# Patient Record
Sex: Female | Born: 1995 | Race: Black or African American | Hispanic: No | Marital: Single | Smoking: Former smoker
Health system: Southern US, Community
[De-identification: ages and names within clinical notes are randomized; demographics above are authoritative.]

## PROBLEM LIST (undated history)

## (undated) DIAGNOSIS — O409XX0 Polyhydramnios, unspecified trimester, not applicable or unspecified: Secondary | ICD-10-CM

## (undated) DIAGNOSIS — J45909 Unspecified asthma, uncomplicated: Secondary | ICD-10-CM

## (undated) DIAGNOSIS — D563 Thalassemia minor: Secondary | ICD-10-CM

## (undated) DIAGNOSIS — E669 Obesity, unspecified: Secondary | ICD-10-CM

## (undated) DIAGNOSIS — J189 Pneumonia, unspecified organism: Secondary | ICD-10-CM

## (undated) HISTORY — PX: NO PAST SURGERIES: SHX2092

---
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
## 2017-06-15 ENCOUNTER — Inpatient Hospital Stay (HOSPITAL_COMMUNITY)
Admission: AD | Admit: 2017-06-15 | Discharge: 2017-06-15 | Disposition: A | Discharge status: Home / Self Care | Payer: Medicaid Other | Source: Ambulatory Visit | Attending: Obstetrics & Gynecology | Admitting: Obstetrics & Gynecology

## 2017-06-15 ENCOUNTER — Encounter (HOSPITAL_COMMUNITY): Payer: Self-pay

## 2017-06-15 DIAGNOSIS — J101 Influenza due to other identified influenza virus with other respiratory manifestations: Secondary | ICD-10-CM | POA: Diagnosis not present

## 2017-06-15 DIAGNOSIS — F17210 Nicotine dependence, cigarettes, uncomplicated: Secondary | ICD-10-CM | POA: Insufficient documentation

## 2017-06-15 DIAGNOSIS — R05 Cough: Secondary | ICD-10-CM | POA: Diagnosis present

## 2017-06-15 DIAGNOSIS — R51 Headache: Secondary | ICD-10-CM | POA: Diagnosis present

## 2017-06-15 LAB — INFLUENZA PANEL BY PCR (TYPE A & B)
INFLBPCR: NEGATIVE
Influenza A By PCR: POSITIVE — AB

## 2017-06-15 MED ORDER — PROMETHAZINE HCL 25 MG PO TABS: 25.0000 mg | ORAL_TABLET | Freq: Four times a day (QID) | ORAL | 2 refills | Status: DC | PRN

## 2017-06-15 MED ORDER — BENZONATATE 100 MG PO CAPS: 200.0000 mg | ORAL_CAPSULE | Freq: Three times a day (TID) | ORAL | 0 refills | Status: DC | PRN

## 2017-06-15 MED ORDER — GUAIFENESIN ER 600 MG PO TB12: 600.0000 mg | ORAL_TABLET | Freq: Two times a day (BID) | ORAL | 1 refills | Status: DC

## 2017-06-15 MED ORDER — PSEUDOEPHEDRINE HCL 30 MG PO TABS: 30.0000 mg | ORAL_TABLET | ORAL | 0 refills | Status: DC | PRN

## 2017-06-15 NOTE — MAU Provider Note (Addendum)
History    Michelle Gallagher is a 22 y.o. Female with hx of asthma who presents due to feeling unwell. Her symptoms started last night. She experienced chills, night sweats, a productive cough, and a headache. The patient took Ibuprofen 200mg  this morning with no relief. She further endorses decreased appetite and fatigue. Walking exacerbates her symptoms while sleeping makes her feel better. Her cousin had the flu 2 weeks ago. The patient did not receive the flu vaccination this year. She denies abdominal pain, nausea, vomiting, or diarrhea.   The patient's exhibits a cough that is productive of yellow, thick sputum. She denies shortness of breath, wheezing, or chest pain. Physical exertion increases her coughing episodes. She is exposed to smoking and tobacco. Her past medical history is significant for asthma.   Her headaches are located in the frontal region. She describes the pain as aching and throbbing, but does not radiate. The pain is 5/10. She has tried NSAIDS, but no relief in symptoms. She denies neck stiffness, vision changes, sore throat and vomiting.    CSN: [id number]  Arrival date and time: 06/15/17 1645   None     Chief Complaint  Patient presents with  . Cough  . Nasal Congestion  . Chills  . Headache     OB History    Gravida Para Term Preterm AB Living   0 0 0 0 0 0   SAB TAB Ectopic Multiple Live Births   0 0 0 0 0      History reviewed. No pertinent past medical history.  History reviewed. No pertinent surgical history.  History reviewed. No pertinent family history.  Social History   Tobacco Use  . Smoking status: Current Some Day Smoker    Types: Cigarettes  . Smokeless tobacco: Never Used  Substance Use Topics  . Alcohol use: Yes  . Drug use: No    Allergies:  Allergies  Allergen Reactions  . Pollen Extract     No medications prior to admission.    Review of Systems  Constitutional: Positive for chills, diaphoresis and fatigue.  HENT:  Negative for ear pain and sore throat.   Respiratory: Positive for cough. Negative for shortness of breath and wheezing.   Gastrointestinal: Negative for abdominal pain, nausea and vomiting.  Neurological: Positive for headaches.   Physical Exam   Blood pressure (!) 125/53, pulse (!) 113, temperature 99.6 F (37.6 C), temperature source Oral, resp. rate 19, height 5\' 5"  (1.651 m), weight 130.6 kg (288 lb), last menstrual period 06/13/2017, SpO2 100 %.  Physical Exam  Constitutional: She is oriented to person, place, and time. She appears well-developed and well-nourished. She is cooperative.  HENT:  Right Ear: External ear normal.  Left Ear: External ear normal.  Mouth/Throat: Oropharynx is clear and moist.  Neck: Normal range of motion.  Cardiovascular: Normal rate, regular rhythm and normal heart sounds.  Respiratory: Effort normal and breath sounds normal.  GI: There is no tenderness.  Lymphadenopathy:    She has no cervical adenopathy.  Neurological: She is alert and oriented to person, place, and time.  Skin: Skin is warm and dry.  Psychiatric: She has a normal mood and affect.    MAU Course  Procedures  MDM: The patient presents with signs and symptoms consistent with influenza. She presents with reports of night sweats, chills, anorexia, fatigue, and a headache. She has a low grade fever, nasal congestion and a productive cough. Influenza PCR confirmed patient had Influenza A.   Assessment  and Plan  Influenza A Medications: Tessealon Pearls to reduce cough. Pseudoephedrine to decrease nasal congestion Promethazine for resolution of nausea Guaifenesin for cough suppresion  Pt advised to stay hydrated. Drink plenty of fluids and initiate a bland diet.   If symptoms don't improve or worsen she should follow up with her family physician.   Anitra Lauth 06/15/2017, 6:03 PM   .I confirm that I have verified the information documented in the Student PA's note and  that I have also personally reperformed the physical exam and all medical decision making activities. The patient was seen and examined by me also Agree with note  Rx Mucinex for congestion Rx Tessalon for cough Rx Phenergan for nausea and sleep Rx Sudafed for congestion Not in high risk group for Tamiflu  Ready for discharge Will have her followup in clinic as scheduled Aviva Signs, CNM

## 2017-06-15 NOTE — Discharge Instructions (Signed)

## 2017-06-15 NOTE — MAU Note (Signed)
Pt reports symptoms since last pm of ? Fever, chills, nasal congestion, cough,

## 2017-06-16 LAB — POCT PREGNANCY, URINE: Preg Test, Ur: NEGATIVE

## 2018-02-03 ENCOUNTER — Emergency Department (HOSPITAL_COMMUNITY)
Admission: EM | Admit: 2018-02-03 | Discharge: 2018-02-03 | Disposition: A | Discharge status: Home / Self Care | Payer: Medicaid Other | Attending: Emergency Medicine | Admitting: Emergency Medicine

## 2018-02-03 DIAGNOSIS — F17210 Nicotine dependence, cigarettes, uncomplicated: Secondary | ICD-10-CM | POA: Insufficient documentation

## 2018-02-03 DIAGNOSIS — J029 Acute pharyngitis, unspecified: Secondary | ICD-10-CM

## 2018-02-03 LAB — GROUP A STREP BY PCR: GROUP A STREP BY PCR: NOT DETECTED

## 2018-02-03 NOTE — Discharge Instructions (Signed)
Take tylenol or motrin. Get help right away if: Your neck becomes stiff. You drool or are unable to swallow liquids. You cannot drink or take medicines without vomiting. You have severe pain that does not go away, even after you take medicine. You have trouble breathing, and it is not caused by a stuffy nose. You have new pain and swelling in your joints such as the knees, ankles, wrists, or elbows.

## 2018-02-03 NOTE — ED Notes (Signed)
Up for d/c  Papers not ready

## 2018-02-03 NOTE — ED Notes (Signed)
Still no d/c papers

## 2018-02-03 NOTE — ED Provider Notes (Signed)
[facility] EMERGENCY DEPARTMENT Provider Note   CSN: [id number] Arrival date & time: 02/03/18  1258     History   Chief Complaint Chief Complaint  Patient presents with  . Sore Throat    HPI Michelle Gallagher is a 22 y.o. female who presents to the ER with cc of sore throat.  She has had symptoms of sore throat for the past 2 days.  She had 2 episodes of vomiting.  She is able to tolerate her own saliva.  She denies fevers or chills.  She has no contacts with similar symptoms.  She denies nasal congestion.  HPI  No past medical history on file.  There are no active problems to display for this patient.   No past surgical history on file.   OB History    Gravida  0   Para  0   Term  0   Preterm  0   AB  0   Living  0     SAB  0   TAB  0   Ectopic  0   Multiple  0   Live Births  0            Home Medications    Prior to Admission medications   Medication Sig Start Date End Date Taking? Authorizing Provider  benzonatate (TESSALON PERLES) 100 MG capsule Take 2 capsules (200 mg total) by mouth 3 (three) times daily as needed for cough. 06/15/17   Aviva Signs, CNM  guaiFENesin (MUCINEX) 600 MG 12 hr tablet Take 1 tablet (600 mg total) by mouth 2 (two) times daily. 06/15/17   Aviva Signs, CNM  promethazine (PHENERGAN) 25 MG tablet Take 1 tablet (25 mg total) by mouth every 6 (six) hours as needed for nausea or vomiting. 06/15/17   Aviva Signs, CNM  pseudoephedrine (SUDAFED) 30 MG tablet Take 1 tablet (30 mg total) by mouth every 4 (four) hours as needed for congestion. 06/15/17   Aviva Signs, CNM    Family History No family history on file.  Social History Social History   Tobacco Use  . Smoking status: Current Some Day Smoker    Types: Cigarettes  . Smokeless tobacco: Never Used  Substance Use Topics  . Alcohol use: Yes  . Drug use: No     Allergies   Pollen extract   Review of Systems Review  of Systems Visited for sore throat, negative for nasal congestion abdominal pain.  Physical Exam Updated Vital Signs BP 122/67 (BP Location: Right Arm)   Pulse 74   Temp 98.4 F (36.9 C) (Oral)   Resp 16   LMP 01/27/2018   SpO2 100%   Physical Exam  Physical Exam  Nursing note and vitals reviewed. Constitutional: She is oriented to person, place, and time. She appears well-developed and well-nourished. No distress.  HENT:  Head: Normocephalic and atraumatic.  Eyes: Conjunctivae normal and EOM are normal. Pupils are equal, round, and reactive to light. No scleral icterus.  Mouth: Mild posterior pharyngeal erythema and swelling without exudate Neck: Normal range of motion. Positive for tonsillar and anterior cervical adenopathy Cardiovascular: Normal rate, regular rhythm and normal heart sounds.  Exam reveals no gallop and no friction rub.   No murmur heard. Pulmonary/Chest: Effort normal and breath sounds normal. No respiratory distress.  Abdominal: Soft. Bowel sounds are normal. She exhibits no distension and no mass. There is no tenderness. There is no guarding.  Neurological: She is alert  and oriented to person, place, and time.  Skin: Skin is warm and dry. She is not diaphoretic.    ED Treatments / Results  Labs (all labs ordered are listed, but only abnormal results are displayed) Labs Reviewed  GROUP A STREP BY PCR    EKG None  Radiology No results found.  Procedures Procedures (including critical care time)  Medications Ordered in ED Medications - No data to display   Initial Impression / Assessment and Plan / ED Course  I have reviewed the triage vital signs and the nursing notes.  Pertinent labs & imaging results that were available during my care of the patient were reviewed by me and considered in my medical decision making (see chart for details).     .Pt afebrile without tonsillar exudate, negative strep. Presents with mild cervical  lymphadenopathy, & dysphagia; diagnosis of viral pharyngitis. No abx indicated. DC w symptomatic tx for pain  Pt does not appear dehydrated, but did discuss importance of water rehydration. Presentation non concerning for PTA or infxn spread to soft tissue. No trismus or uvula deviation. Specific return precautions discussed. Pt able to drink water in ED without difficulty with intact air way. Recommended PCP follow up.   Final Clinical Impressions(s) / ED Diagnoses   Final diagnoses:  Viral pharyngitis    ED Discharge Orders    None       Arthor Captain, PA-C 02/03/18 1531    Linwood Dibbles, MD 02/05/18 [phone]

## 2018-02-03 NOTE — ED Triage Notes (Addendum)
Pt reports 2 days of coughing and sore throat, pain worse with swallowing and coughing. Pt states her cough today has been productive with thick mucus and she noticed some red streaks that looks like blood. Tonsils are enlarged. Denies any sob or chest pain.

## 2018-02-03 NOTE — ED Notes (Signed)
Strep swab sent lab

## 2018-02-03 NOTE — ED Notes (Signed)
s asked if she minded the pts mother asking questions about the pts condition  When the person in room reported she was the mother and she needed to ask  I was just asking for the pts permission

## 2018-12-14 ENCOUNTER — Other Ambulatory Visit: Payer: Self-pay

## 2018-12-14 ENCOUNTER — Ambulatory Visit (HOSPITAL_COMMUNITY)
Admission: EM | Admit: 2018-12-14 | Discharge: 2018-12-14 | Disposition: A | Discharge status: Home / Self Care | Payer: Medicaid Other | Attending: Family Medicine | Admitting: Family Medicine

## 2018-12-14 ENCOUNTER — Encounter (HOSPITAL_COMMUNITY): Payer: Self-pay | Admitting: Emergency Medicine

## 2018-12-14 DIAGNOSIS — N898 Other specified noninflammatory disorders of vagina: Secondary | ICD-10-CM

## 2018-12-14 DIAGNOSIS — Z202 Contact with and (suspected) exposure to infections with a predominantly sexual mode of transmission: Secondary | ICD-10-CM

## 2018-12-14 DIAGNOSIS — Z3201 Encounter for pregnancy test, result positive: Secondary | ICD-10-CM | POA: Diagnosis present

## 2018-12-14 DIAGNOSIS — S00501A Unspecified superficial injury of lip, initial encounter: Secondary | ICD-10-CM

## 2018-12-14 DIAGNOSIS — W01198A Fall on same level from slipping, tripping and stumbling with subsequent striking against other object, initial encounter: Secondary | ICD-10-CM

## 2018-12-14 LAB — POCT PREGNANCY, URINE: Preg Test, Ur: POSITIVE — AB

## 2018-12-14 NOTE — Discharge Instructions (Addendum)
Your pregnancy test is positive.  You should recheck this in a week to reconfirm. If still positive you need to follow-up with an OB/GYN for further management of your pregnancy We are also checking you for STDs and will call you with any positive results. I believe that the lip injury is healing well and not infected. You can do some warm salt water rinses in place some Neosporin on the area If your facial discomfort continues over the next couple weeks and does not improve you may need to see a specialist and have some imaging done of the face.  Most likely just muscle soreness

## 2018-12-14 NOTE — ED Provider Notes (Addendum)
[place]    CSN: [id number] Arrival date & time: 12/14/18  1217      History   Chief Complaint Chief Complaint  Patient presents with  . Oral Swelling  . Exposure to STD  . Possible Pregnancy    HPI Michelle Gallagher is a 23 y.o. female.   Patient is a 23 year old female that presents today for facial injury.  Reports this occurred approximately 1 week ago when she was intoxicated and fell hitting face on the concrete.  Reporting she does not remember most of the incident. Believes that her tooth may have cut a hole in her lower inner lip.  Reports that it is somewhat healed and feels better but she wanted to make sure it was not infected.  Denies any significant pain, fever or drainage from the lip.  She has been cleaning with peroxide.  She has had some facial soreness more with opening and closing the jaw. No trismus. No facial swelling.  No dizziness, headache, blurred vision.  She also would like to be checked for STDs and pregnancy. She had unprotected sex a few weeks ago. She has been having vaginal discharge. No abd pain, dysuria, hematuria, urinary frequency. No fever.   ROS per HPI      History reviewed. No pertinent past medical history.  There are no active problems to display for this patient.   History reviewed. No pertinent surgical history.  OB History    Gravida  0   Para  0   Term  0   Preterm  0   AB  0   Living  0     SAB  0   TAB  0   Ectopic  0   Multiple  0   Live Births  0            Home Medications    Prior to Admission medications   Medication Sig Start Date End Date Taking? Authorizing Provider  promethazine (PHENERGAN) 25 MG tablet Take 1 tablet (25 mg total) by mouth every 6 (six) hours as needed for nausea or vomiting. 06/15/17 12/14/18  Seabron Spates, CNM    Family History No family history on file.  Social History Social History   Tobacco Use  . Smoking status: Current Some Day Smoker    Types: Cigarettes  . Smokeless tobacco: Never Used  Substance Use Topics  . Alcohol use: Yes  . Drug use: No     Allergies   Pollen extract   Review of Systems Review of Systems   Physical Exam Triage Vital Signs ED Triage Vitals  Enc Vitals Group     BP 12/14/18 1301 114/68     Pulse Rate 12/14/18 1301 72     Resp --      Temp 12/14/18 1301 98.1 F (36.7 C)     Temp src --      SpO2 12/14/18 1301 100 %     Weight --      Height --      Head Circumference --      Peak Flow --      Pain Score 12/14/18 1302 4     Pain Loc --      Pain Edu? --      Excl. in [place]? --    No data found.  Updated Vital Signs BP 114/68   Pulse 72   Temp 98.1 F (36.7 C)   LMP 11/18/2018   SpO2 100%  Visual Acuity Right Eye Distance:   Left Eye Distance:   Bilateral Distance:    Right Eye Near:   Left Eye Near:    Bilateral Near:     Physical Exam Vitals signs and nursing note reviewed.  Constitutional:      General: She is not in acute distress.    Appearance: Normal appearance. She is not ill-appearing, toxic-appearing or diaphoretic.  HENT:     Head: Normocephalic and atraumatic.     Right Ear: Tympanic membrane and ear canal normal.     Left Ear: Tympanic membrane and ear canal normal.     Nose: Nose normal.     Mouth/Throat:     Pharynx: Oropharynx is clear.     Comments: Healing wound to lower inner lip.  No drainage or bleeding. Mild swelling.  Mild tenderness to palpation of the mandible No popping or clicking with the TMJ No trismus Teeth intact.  Eyes:     Extraocular Movements: Extraocular movements intact.     Conjunctiva/sclera: Conjunctivae normal.     Pupils: Pupils are equal, round, and reactive to light.  Neck:     Musculoskeletal: Normal range of motion.  Cardiovascular:     Rate and Rhythm: Normal rate and regular rhythm.  Pulmonary:     Effort: Pulmonary effort is normal.     Breath sounds: Normal breath sounds.  Abdominal:      Palpations: Abdomen is soft.     Tenderness: There is no abdominal tenderness.  Musculoskeletal: Normal range of motion.  Skin:    General: Skin is warm and dry.  Neurological:     General: No focal deficit present.     Mental Status: She is alert.  Psychiatric:        Mood and Affect: Mood normal.      UC Treatments / Results  Labs (all labs ordered are listed, but only abnormal results are displayed) Labs Reviewed  POCT PREGNANCY, URINE - Abnormal; Notable for the following components:      Result Value   Preg Test, Ur POSITIVE (*)    All other components within normal limits  HIV ANTIBODY (ROUTINE TESTING W REFLEX)  RPR  POC URINE PREG, ED  CERVICOVAGINAL ANCILLARY ONLY    EKG   Radiology No results found.  Procedures Procedures (including critical care time)  Medications Ordered in UC Medications - No data to display  Initial Impression / Assessment and Plan / UC Course  I have reviewed the triage vital signs and the nursing notes.  Pertinent labs & imaging results that were available during my care of the patient were reviewed by me and considered in my medical decision making (see chart for details).      Patient is a 23 year old female with facial injury.  Nothing concerning on exam.  Most likely still having muscle tenderness post fall.  Lip appears to be healing and not infected.  Recommended take ibuprofen as needed for pain and doing salt water rinses  Patient's pregnancy test was positive.  This was a surprise for her.  Recommend she retest in a week to confirm. Sent swab for testing for BV, yeast, gonorrhea, chlamydia and trichomonas. HIV and syphilis drawn Contact given for OB/GYN  Final Clinical Impressions(s) / UC Diagnoses   Final diagnoses:  Positive pregnancy test     Discharge Instructions     Your pregnancy test is positive.  You should recheck this in a week to reconfirm. If still positive you need to  follow-up with an OB/GYN for  further management of your pregnancy We are also checking you for STDs and will call you with any positive results. I believe that the lip injury is healing well and not infected. You can do some warm salt water rinses in place some Neosporin on the area If your facial discomfort continues over the next couple weeks and does not improve you may need to see a specialist and have some imaging done of the face.  Most likely just muscle soreness    ED Prescriptions    None     Controlled Substance Prescriptions [place] Controlled Substance Registry consulted? Not Applicable   Janace ArisBast, Vestal Crandall A, NP 12/15/18 1205    Dahlia ByesBast, Amarya Kuehl A, NP 12/15/18 1206

## 2018-12-14 NOTE — ED Triage Notes (Signed)
Pt  Here today c/o Lip check due to biting also would like to be check for HCG/STD testing

## 2018-12-15 LAB — HIV ANTIBODY (ROUTINE TESTING W REFLEX): HIV Screen 4th Generation wRfx: NONREACTIVE

## 2018-12-15 LAB — RPR: RPR Ser Ql: NONREACTIVE

## 2018-12-16 ENCOUNTER — Telehealth (HOSPITAL_COMMUNITY): Payer: Self-pay | Admitting: Emergency Medicine

## 2018-12-16 ENCOUNTER — Encounter (HOSPITAL_COMMUNITY): Payer: Self-pay

## 2018-12-16 LAB — CERVICOVAGINAL ANCILLARY ONLY
Bacterial vaginitis: POSITIVE — AB
Candida vaginitis: POSITIVE — AB
Chlamydia: NEGATIVE
Neisseria Gonorrhea: NEGATIVE
Trichomonas: NEGATIVE

## 2018-12-16 MED ORDER — TERCONAZOLE 0.4 % VA CREA: 1.0000 | TOPICAL_CREAM | Freq: Every day | VAGINAL | 0 refills | Status: AC

## 2018-12-16 MED ORDER — METRONIDAZOLE 0.75 % VA GEL: 1.0000 | Freq: Every day | VAGINAL | 0 refills | Status: AC

## 2018-12-16 NOTE — Telephone Encounter (Signed)
Patient contacted and made aware of swab results, all questions answered.   

## 2018-12-16 NOTE — Telephone Encounter (Signed)
Bacterial vaginosis is positive. This was not treated at the urgent care visit. metrogel sent to patients pharmacy of choice.    Test for candida (yeast) was positive.  Prescription for terconazole cream sent to the pharmacy of record.  Recheck or followup with PCP for further evaluation if symptoms are not improving.    Attempted to reach patient. No answer at this time. Voicemail left.

## 2018-12-17 ENCOUNTER — Other Ambulatory Visit: Payer: Self-pay

## 2018-12-17 ENCOUNTER — Encounter (HOSPITAL_COMMUNITY): Payer: Self-pay | Admitting: *Deleted

## 2018-12-17 ENCOUNTER — Inpatient Hospital Stay (HOSPITAL_COMMUNITY): Payer: Medicaid Other

## 2018-12-17 ENCOUNTER — Inpatient Hospital Stay (HOSPITAL_COMMUNITY)
Admission: AD | Admit: 2018-12-17 | Discharge: 2018-12-17 | Disposition: A | Discharge status: Home / Self Care | Payer: Medicaid Other | Attending: Obstetrics and Gynecology | Admitting: Obstetrics and Gynecology

## 2018-12-17 DIAGNOSIS — F17210 Nicotine dependence, cigarettes, uncomplicated: Secondary | ICD-10-CM | POA: Diagnosis not present

## 2018-12-17 DIAGNOSIS — B373 Candidiasis of vulva and vagina: Secondary | ICD-10-CM | POA: Diagnosis not present

## 2018-12-17 DIAGNOSIS — R102 Pelvic and perineal pain: Secondary | ICD-10-CM

## 2018-12-17 DIAGNOSIS — O98811 Other maternal infectious and parasitic diseases complicating pregnancy, first trimester: Secondary | ICD-10-CM | POA: Diagnosis not present

## 2018-12-17 DIAGNOSIS — B9689 Other specified bacterial agents as the cause of diseases classified elsewhere: Secondary | ICD-10-CM | POA: Diagnosis not present

## 2018-12-17 DIAGNOSIS — O99331 Smoking (tobacco) complicating pregnancy, first trimester: Secondary | ICD-10-CM | POA: Insufficient documentation

## 2018-12-17 DIAGNOSIS — Z3A01 Less than 8 weeks gestation of pregnancy: Secondary | ICD-10-CM | POA: Insufficient documentation

## 2018-12-17 DIAGNOSIS — R109 Unspecified abdominal pain: Secondary | ICD-10-CM | POA: Insufficient documentation

## 2018-12-17 DIAGNOSIS — O3680X0 Pregnancy with inconclusive fetal viability, not applicable or unspecified: Secondary | ICD-10-CM

## 2018-12-17 DIAGNOSIS — O26899 Other specified pregnancy related conditions, unspecified trimester: Secondary | ICD-10-CM

## 2018-12-17 DIAGNOSIS — O23591 Infection of other part of genital tract in pregnancy, first trimester: Secondary | ICD-10-CM | POA: Diagnosis not present

## 2018-12-17 DIAGNOSIS — Z3490 Encounter for supervision of normal pregnancy, unspecified, unspecified trimester: Secondary | ICD-10-CM

## 2018-12-17 DIAGNOSIS — O26891 Other specified pregnancy related conditions, first trimester: Secondary | ICD-10-CM

## 2018-12-17 LAB — URINALYSIS, ROUTINE W REFLEX MICROSCOPIC
Bilirubin Urine: NEGATIVE
Glucose, UA: NEGATIVE mg/dL
Hgb urine dipstick: NEGATIVE
Ketones, ur: NEGATIVE mg/dL
Leukocytes,Ua: NEGATIVE
Nitrite: NEGATIVE
Protein, ur: 30 mg/dL — AB
Specific Gravity, Urine: 1.031 — ABNORMAL HIGH (ref 1.005–1.030)
pH: 6 (ref 5.0–8.0)

## 2018-12-17 LAB — ABO/RH: ABO/RH(D): O POS

## 2018-12-17 LAB — HCG, QUANTITATIVE, PREGNANCY: hCG, Beta Chain, Quant, S: 201 m[IU]/mL — ABNORMAL HIGH (ref <5)

## 2018-12-17 NOTE — MAU Note (Signed)
When she was at work, was having cramping in her lower abd, ? Uterus.  No longer having it.  Not having the sharp pain since she got here, but is having minor cramping.

## 2018-12-17 NOTE — Discharge Instructions (Signed)

## 2018-12-17 NOTE — MAU Provider Note (Signed)
History     CSN: [id number]  Arrival date and time: 12/17/18 1547   First Provider Initiated Contact with Patient 12/17/18 1650      Chief Complaint  Patient presents with  . Abdominal Pain   Michelle Gallagher is a 23 y.o. G1P0 at [redacted]w[redacted]d by Definite LMP of November 11, 2018.  She presents today for Abdominal Pain.  She states she has been having intermittent sharp shooting pain in her lower abdomen for the last week.  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 She endorses recent diagnosis of BV and yeast, but has yet to pick up her prescriptions.      OB History    Gravida  1   Para  0   Term  0   Preterm  0   AB  0   Living  0     SAB  0   TAB  0   Ectopic  0   Multiple  0   Live Births  0           Past Medical History:  Diagnosis Date  . Medical history non-contributory     Past Surgical History:  Procedure Laterality Date  . NO PAST SURGERIES      No family history on file.  Social History   Tobacco Use  . Smoking status: Current Some Day Smoker    Types: Cigarettes  . Smokeless tobacco: Never Used  Substance Use Topics  . Alcohol use: Yes  . Drug use: No    Allergies:  Allergies  Allergen Reactions  . Pollen Extract     Medications Prior to Admission  Medication Sig Dispense Refill Last Dose  . metroNIDAZOLE (METROGEL VAGINAL) 0.75 % vaginal gel Place 1 Applicatorful vaginally at bedtime for 5 days. 50 g 0  at not taking yet  . terconazole (TERAZOL 7) 0.4 % vaginal cream Place 1 applicator vaginally at bedtime for 7 days. 45 g 0  at not taking yet    Review of Systems  Constitutional: Negative for chills and fever.  Respiratory: Negative for cough and shortness of breath.   Gastrointestinal: Positive for abdominal pain (None currently). Negative for constipation, diarrhea, nausea and vomiting.  Genitourinary: Negative for difficulty urinating, dysuria, vaginal  bleeding and vaginal discharge.  Neurological: Negative for dizziness, light-headedness and headaches.   Physical Exam   Blood pressure 117/63, pulse 79, temperature 98.2 F (36.8 C), resp. rate 16, height 5\' 5"  (1.651 m), weight 135.7 kg, last menstrual period 11/18/2018, SpO2 100 %.  Physical Exam  Constitutional: She is oriented to person, place, and time. No distress.  Obese  HENT:  Head: Normocephalic and atraumatic.  Eyes: Conjunctivae are normal.  Neck: Normal range of motion.  Cardiovascular: Normal rate, regular rhythm and normal heart sounds.  Respiratory: Effort normal and breath sounds normal.  GI: Soft. Bowel sounds are normal. There is no abdominal tenderness.  Neurological: She is alert and oriented to person, place, and time.  Skin: Skin is warm and dry.  Psychiatric: She has a normal mood and affect. Her behavior is normal.    MAU Course  Procedures Results for orders placed or performed during the hospital encounter of 12/17/18 (from the past 24 hour(s))  Urinalysis, Routine w reflex microscopic     Status: Abnormal   Collection Time: 12/17/18  4:30 PM  Result Value Ref Range   Color, Urine YELLOW YELLOW  APPearance HAZY (A) CLEAR   Specific Gravity, Urine 1.031 (H) 1.005 - 1.030   pH 6.0 5.0 - 8.0   Glucose, UA NEGATIVE NEGATIVE mg/dL   Hgb urine dipstick NEGATIVE NEGATIVE   Bilirubin Urine NEGATIVE NEGATIVE   Ketones, ur NEGATIVE NEGATIVE mg/dL   Protein, ur 30 (A) NEGATIVE mg/dL   Nitrite NEGATIVE NEGATIVE   Leukocytes,Ua NEGATIVE NEGATIVE   RBC / HPF 0-5 0 - 5 RBC/hpf   WBC, UA 0-5 0 - 5 WBC/hpf   Bacteria, UA RARE (A) NONE SEEN   Squamous Epithelial / LPF 0-5 0 - 5   Mucus PRESENT   hCG, quantitative, pregnancy     Status: Abnormal   Collection Time: 12/17/18  5:11 PM  Result Value Ref Range   hCG, Beta Chain, Quant, S 201 (H) <5 mIU/mL  ABO/Rh     Status: None   Collection Time: 12/17/18  5:12 PM  Result Value Ref Range   ABO/RH(D)      O  POS Performed at [facility], [address] [place], [place] [id number]    [place] Ob Less Than 14 Weeks With Ob Transvaginal  Result Date: 12/17/2018 CLINICAL DATA:  Pregnant patient with abdominal pain and cramping. EXAM: OBSTETRIC <14 WK US AND TRANSVAGINAL OB US TECHNIQUE: Both transabdominal and transvaginal ultrasound examinations were performed for complete evaluation of the gestation as well as the maternal uterus, adnexal regions, and pelvic cul-de-sac. Transvaginal technique was performed to assess early pregnancy. COMPARISON:  None. FINDINGS: Intrauterine gestational sac: None Yolk sac:  Not Visualized. Embryo:  Not Visualized. Cardiac Activity: Not Visualized. Subchorionic hemorrhage:  None visualized. Maternal uterus/adnexae: Normal left ovary. Right ovary is not visualized. Small amount of free fluid in the pelvis. IMPRESSION: No intrauterine gestation identified. In the setting of positive pregnancy test and no definite intrauterine pregnancy, this reflects a pregnancy of unknown location. Differential considerations include early normal IUP, abnormal IUP, or nonvisualized ectopic pregnancy. Differentiation is achieved with serial beta HCG supplemented by repeat sonography as clinically warranted. Electronically Signed   By: Annia Beltrew  Davis M.D.   On: 12/17/2018 18:48    MDM Physical Exam Labs: hcG and ABO Ultrasound Assessment and Plan  23 year old G1P0 Pregnancy of Unknown Anatomical Location Round Ligament Pain BV and Yeast  -Exam findings discussed -Informed that BV and yeast can cause exaggerated symptoms related to RLP. -Instructed to pick up Rx for infections and start immediately.  -Discussed need to rule out ectopic pregnancy as pregnancy location unknown. -Will send for ultrasound. -Will collect ABO and hCG.  Cherre RobinsJessica L Delano Scardino MSN, CNM 12/17/2018, 4:50 PM   Reassessment (6:31 PM) Early Pregnancy HCG 201  -Informed of lab results. -Discussed how US results still  pending, but unlikely to show any findings due to hCG levels. -Patient informed that if US shows otherwise, provider would contact her accordingly. -Discussed need for follow up quant in 48 hours on Sunday Sept 13 in MAU after 6pm. *Order signed and held. -Discussed procedure for MAU lab draws. -Reiterated need to obtain medications and treat infections. -Discussed usage of pregnancy support belt/band.  -Encouraged to call or return to MAU if symptoms worsen or with the onset of new symptoms. -No other questions or concerns.  -Discharged to home in stable condition.  Cherre RobinsJessica L Ugonna Keirsey MSN, CNM

## 2018-12-20 ENCOUNTER — Inpatient Hospital Stay (HOSPITAL_COMMUNITY)
Admission: AD | Admit: 2018-12-20 | Discharge: 2018-12-20 | Disposition: A | Discharge status: Home / Self Care | Payer: Medicaid Other | Attending: Obstetrics and Gynecology | Admitting: Obstetrics and Gynecology

## 2018-12-20 ENCOUNTER — Other Ambulatory Visit: Payer: Self-pay

## 2018-12-20 DIAGNOSIS — Z3A01 Less than 8 weeks gestation of pregnancy: Secondary | ICD-10-CM | POA: Diagnosis not present

## 2018-12-20 DIAGNOSIS — O26891 Other specified pregnancy related conditions, first trimester: Secondary | ICD-10-CM | POA: Insufficient documentation

## 2018-12-20 DIAGNOSIS — O3680X0 Pregnancy with inconclusive fetal viability, not applicable or unspecified: Secondary | ICD-10-CM

## 2018-12-20 DIAGNOSIS — E349 Endocrine disorder, unspecified: Secondary | ICD-10-CM

## 2018-12-20 LAB — HCG, QUANTITATIVE, PREGNANCY: hCG, Beta Chain, Quant, S: 1066 m[IU]/mL — ABNORMAL HIGH (ref <5)

## 2018-12-20 NOTE — MAU Note (Signed)
No transportation for yesterday's appt.  Feeling good,  No bleeding or cramping.  Has had an occ cramp, states has not picked up rx for infections yet.

## 2018-12-20 NOTE — MAU Provider Note (Signed)
Subjective:  Michelle Gallagher is a 23 y.o. G1P0000 at [redacted]w[redacted]d who presents today for FU BHCG. She was seen on 12/17/2018 for abdominal pain without vaginal bleeding. Results from that day show no IUP on [place], and HCG 201. Pt was to return to MAU on Sunday night for repeat hCG, but reports she did not have transportation to get here and arrives this afternoon. She denies vaginal bleeding. She denies abdominal or pelvic pain.   Objective:  Physical Exam  Nursing note and vitals reviewed.  Patient Vitals for the past 24 hrs:  BP Temp Pulse Resp SpO2  12/20/18 1422 140/79 98 F (36.7 C) 84 18 100 %   Constitutional: She is oriented to person, place, and time. She appears well-developed and well-nourished. No distress.  HENT:  Head: Normocephalic.  Cardiovascular: Normal rate.  Respiratory: Effort normal.  GI: Soft. There is no tenderness.  Neurological: She is alert and oriented to person, place, and time. Skin: Skin is warm and dry.  Psychiatric: She has a normal mood and affect.   Results for orders placed or performed during the hospital encounter of 12/20/18 (from the past 24 hour(s))  hCG, quantitative, pregnancy     Status: Abnormal   Collection Time: 12/20/18  2:55 PM  Result Value Ref Range   hCG, Beta Chain, Quant, S 1,066 (H) <5 mIU/mL    Assessment/Plan: Pregnancy of unknown location HCG likely did rise appropriately, but the length of time between blood draws is greater than 48hrs. FU in 2 days for: repeat hCG, appt scheduled at [place] for 12/23/2018 @0830AM  FU in 2 weeks for [place] for viability, order entered Pt to return to MAU with pelvic/abdominal pain and/or bleeding or PRN for other pregnancy concerns Pt's sister present for entire visit Pt discharged to home in stable condition

## 2018-12-20 NOTE — Discharge Instructions (Signed)

## 2018-12-23 ENCOUNTER — Other Ambulatory Visit: Payer: Self-pay

## 2018-12-23 ENCOUNTER — Ambulatory Visit: Payer: Medicaid Other | Admitting: Lactation Services

## 2018-12-23 DIAGNOSIS — O3680X0 Pregnancy with inconclusive fetal viability, not applicable or unspecified: Secondary | ICD-10-CM

## 2018-12-23 LAB — BETA HCG QUANT (REF LAB): hCG Quant: 2738 m[IU]/mL

## 2018-12-23 MED ORDER — PRENATAL PLUS 27-1 MG PO TABS: 1.0000 | ORAL_TABLET | Freq: Every day | ORAL | 11 refills | Status: DC

## 2018-12-23 NOTE — Addendum Note (Signed)
Addended by: Donn Pierini on: 12/23/2018 02:02 PM   Modules accepted: Orders

## 2018-12-23 NOTE — Progress Notes (Addendum)
Pt here for follow up Hcg today. Pt denies bleeding, pain or cramping. Pt reports she has no questions or concerns at this time. Let pt know that we will call her  today with her results and follow up.   12:47 pm received call of Stat hcg of 2738  13:58 spoke with Noni Saupe, NP about findings and Hcg level today. Hcg levels are rising. She recommended pt keep [place] appt on 9/28 and to report to MAU for pain and or bleeding.   14:00 pt was called and informed of results and follow up US. Pt voiced understanding.    PNV orders sent in to [facility] on [address].   Pt to call with any questions/concerns as needed.

## 2019-01-01 ENCOUNTER — Other Ambulatory Visit: Payer: Self-pay

## 2019-01-01 ENCOUNTER — Inpatient Hospital Stay (HOSPITAL_COMMUNITY)
Admission: AD | Admit: 2019-01-01 | Discharge: 2019-01-01 | Disposition: A | Discharge status: Home / Self Care | Payer: Medicaid Other | Attending: Obstetrics & Gynecology | Admitting: Obstetrics & Gynecology

## 2019-01-01 ENCOUNTER — Encounter (HOSPITAL_COMMUNITY): Payer: Self-pay | Admitting: *Deleted

## 2019-01-01 DIAGNOSIS — O26891 Other specified pregnancy related conditions, first trimester: Secondary | ICD-10-CM | POA: Diagnosis not present

## 2019-01-01 DIAGNOSIS — Z87891 Personal history of nicotine dependence: Secondary | ICD-10-CM | POA: Insufficient documentation

## 2019-01-01 DIAGNOSIS — O99511 Diseases of the respiratory system complicating pregnancy, first trimester: Secondary | ICD-10-CM | POA: Diagnosis not present

## 2019-01-01 DIAGNOSIS — J45909 Unspecified asthma, uncomplicated: Secondary | ICD-10-CM | POA: Diagnosis not present

## 2019-01-01 DIAGNOSIS — R519 Headache, unspecified: Secondary | ICD-10-CM

## 2019-01-01 DIAGNOSIS — R51 Headache: Secondary | ICD-10-CM

## 2019-01-01 DIAGNOSIS — Z3A01 Less than 8 weeks gestation of pregnancy: Secondary | ICD-10-CM | POA: Insufficient documentation

## 2019-01-01 HISTORY — DX: Unspecified asthma, uncomplicated: J45.909

## 2019-01-01 LAB — URINALYSIS, ROUTINE W REFLEX MICROSCOPIC
Bilirubin Urine: NEGATIVE
Glucose, UA: NEGATIVE mg/dL
Hgb urine dipstick: NEGATIVE
Ketones, ur: 20 mg/dL — AB
Leukocytes,Ua: NEGATIVE
Nitrite: NEGATIVE
Protein, ur: NEGATIVE mg/dL
Specific Gravity, Urine: 1.014 (ref 1.005–1.030)
pH: 6 (ref 5.0–8.0)

## 2019-01-01 MED ORDER — METOCLOPRAMIDE HCL 10 MG PO TABS
10.0000 mg | ORAL_TABLET | Freq: Once | ORAL | Status: AC
  Administered 2019-01-01: 18:00:00 10 mg via ORAL
  Filled 2019-01-01: qty 1

## 2019-01-01 MED ORDER — METOCLOPRAMIDE HCL 10 MG PO TABS: 10.0000 mg | ORAL_TABLET | Freq: Three times a day (TID) | ORAL | 0 refills | Status: DC | PRN

## 2019-01-01 MED ORDER — ACETAMINOPHEN 500 MG PO TABS
1000.0000 mg | ORAL_TABLET | Freq: Once | ORAL | Status: AC
  Administered 2019-01-01: 1000 mg via ORAL
  Filled 2019-01-01: qty 2

## 2019-01-01 NOTE — Discharge Instructions (Signed)

## 2019-01-01 NOTE — MAU Note (Signed)
Michelle Gallagher is a 23 y.o. at [redacted]w[redacted]d here in MAU reporting: has had a migraine on the right side of her head since yesterday, has not tried any medication. Is feeling lightheaded. And having trouble urinating. No burning, no urgency, no frequency.  Onset of complaint: yesterday  Pain score: 7/10  Vitals:   01/01/19 1545  BP: 113/82  Pulse: 79  Resp: 18  Temp: 98.4 F (36.9 C)  SpO2: 100%      Lab orders placed from triage: UA

## 2019-01-01 NOTE — MAU Provider Note (Signed)
Chief Complaint: Headache   First Provider Initiated Contact with Patient 01/01/19 1751     SUBJECTIVE HPI: Michelle Gallagher is a 23 y.o. G1P0000 at [redacted]w[redacted]d who presents to Maternity Admissions reporting headache. Symptoms started gradually yesterday. Right sided throbbing. Worse with lights & sounds. Has not treated symptoms. Endorses nausea. No vomiting. Denies fever, sore throat, cough, or sob. No abdominal pain or vaginal bleeding.   Location: head Quality: throbbing Severity: 5/10 on pain scale Duration: 1 day Timing: constant Modifying factors: hasn't treated symptoms. Worse with lights & sounds Associated signs and symptoms: nausea  Past Medical History:  Diagnosis Date  . Asthma    OB History  Gravida Para Term Preterm AB Living  1 0 0 0 0 0  SAB TAB Ectopic Multiple Live Births  0 0 0 0 0    # Outcome Date GA Lbr Len/2nd Weight Sex Delivery Anes PTL Lv  1 Current            Past Surgical History:  Procedure Laterality Date  . NO PAST SURGERIES     Social History   Socioeconomic History  . Marital status: Single    Spouse name: Not on file  . Number of children: Not on file  . Years of education: Not on file  . Highest education level: Not on file  Occupational History  . Not on file  Social Needs  . Financial resource strain: Not on file  . Food insecurity    Worry: Not on file    Inability: Not on file  . Transportation needs    Medical: Not on file    Non-medical: Not on file  Tobacco Use  . Smoking status: Former Smoker    Types: Cigarettes    Quit date: 12/07/2018    Years since quitting: 0.0  . Smokeless tobacco: Never Used  Substance and Sexual Activity  . Alcohol use: Not Currently  . Drug use: No  . Sexual activity: Yes    Birth control/protection: None  Lifestyle  . Physical activity    Days per week: Not on file    Minutes per session: Not on file  . Stress: Not on file  Relationships  . Social Herbalist on phone: Not on  file    Gets together: Not on file    Attends religious service: Not on file    Active member of club or organization: Not on file    Attends meetings of clubs or organizations: Not on file    Relationship status: Not on file  . Intimate partner violence    Fear of current or ex partner: Not on file    Emotionally abused: Not on file    Physically abused: Not on file    Forced sexual activity: Not on file  Other Topics Concern  . Not on file  Social History Narrative  . Not on file   No family history on file. No current facility-administered medications on file prior to encounter.    Current Outpatient Medications on File Prior to Encounter  Medication Sig Dispense Refill  . prenatal vitamin w/FE, FA (PRENATAL 1 + 1) 27-1 MG TABS tablet Take 1 tablet by mouth daily at 12 noon. 30 tablet 11  . [DISCONTINUED] promethazine (PHENERGAN) 25 MG tablet Take 1 tablet (25 mg total) by mouth every 6 (six) hours as needed for nausea or vomiting. 30 tablet 2   Allergies  Allergen Reactions  . Pollen Extract  I have reviewed patient's Past Medical Hx, Surgical Hx, Family Hx, Social Hx, medications and allergies.   Review of Systems  Constitutional: Negative.   Eyes: Positive for photophobia.  Respiratory: Negative.   Gastrointestinal: Positive for nausea. Negative for abdominal pain and vomiting.  Neurological: Positive for headaches.    OBJECTIVE Patient Vitals for the past 24 hrs:  BP Temp Temp src Pulse Resp SpO2 Height Weight  01/01/19 1545 113/82 98.4 F (36.9 C) Oral 79 18 100 % - -  01/01/19 1541 - - - - - - 5\' 5"  (1.651 m) 130.9 kg   Constitutional: Well-developed, well-nourished female in no acute distress.  Cardiovascular: normal rate & rhythm, no murmur Respiratory: normal rate and effort. Lung sounds clear throughout GI: Abd soft, non-tender, Pos BS x 4. No guarding or rebound tenderness MS: Extremities nontender, no edema, normal ROM Neurologic: Alert and  oriented x 4.      LAB RESULTS Results for orders placed or performed during the hospital encounter of 01/01/19 (from the past 24 hour(s))  Urinalysis, Routine w reflex microscopic     Status: Abnormal   Collection Time: 01/01/19  5:37 PM  Result Value Ref Range   Color, Urine YELLOW YELLOW   APPearance HAZY (A) CLEAR   Specific Gravity, Urine 1.014 1.005 - 1.030   pH 6.0 5.0 - 8.0   Glucose, UA NEGATIVE NEGATIVE mg/dL   Hgb urine dipstick NEGATIVE NEGATIVE   Bilirubin Urine NEGATIVE NEGATIVE   Ketones, ur 20 (A) NEGATIVE mg/dL   Protein, ur NEGATIVE NEGATIVE mg/dL   Nitrite NEGATIVE NEGATIVE   Leukocytes,Ua NEGATIVE NEGATIVE    IMAGING No results found.  MAU COURSE Orders Placed This Encounter  Procedures  . Urinalysis, Routine w reflex microscopic  . Discharge patient   Meds ordered this encounter  Medications  . acetaminophen (TYLENOL) tablet 1,000 mg  . metoCLOPramide (REGLAN) tablet 10 mg  . metoCLOPramide (REGLAN) 10 MG tablet    Sig: Take 1 tablet (10 mg total) by mouth every 8 (eight) hours as needed for nausea (or headace).    Dispense:  30 tablet    Refill:  0    Order Specific Question:   Supervising Provider    Answer:   01/03/19 A [[id number]]    MDM Headache down to 2/10 after receiving tylenol 1 gm & reglan 10 mg.   ASSESSMENT 1. Pregnancy headache in first trimester   2. [redacted] weeks gestation of pregnancy     PLAN Discharge home in stable condition. Rx reglan Discussed reasons to return to MAU Discussed tx of future headaches  Follow-up Information    [facility] Follow up.   Specialty: Obstetrics and Gynecology Why: return for worsening symptoms Contact information: [address] [id number] [place] [phone]         Allergies as of 01/01/2019      Reactions   Pollen Extract       Medication List    TAKE these medications   metoCLOPramide 10 MG tablet Commonly known  as: REGLAN Take 1 tablet (10 mg total) by mouth every 8 (eight) hours as needed for nausea (or headace).   prenatal vitamin w/FE, FA 27-1 MG Tabs tablet Take 1 tablet by mouth daily at 12 noon.        01/03/2019, NP 01/01/2019  7:14 PM

## 2019-01-03 ENCOUNTER — Ambulatory Visit (HOSPITAL_COMMUNITY)
Admission: RE | Admit: 2019-01-03 | Discharge: 2019-01-03 | Disposition: A | Discharge status: Home / Self Care | Payer: Medicaid Other | Source: Ambulatory Visit | Attending: Women's Health | Admitting: Women's Health

## 2019-01-03 ENCOUNTER — Ambulatory Visit (INDEPENDENT_AMBULATORY_CARE_PROVIDER_SITE_OTHER): Payer: Medicaid Other | Admitting: General Practice

## 2019-01-03 ENCOUNTER — Other Ambulatory Visit: Payer: Self-pay

## 2019-01-03 ENCOUNTER — Other Ambulatory Visit (HOSPITAL_COMMUNITY): Payer: Self-pay | Admitting: Women's Health

## 2019-01-03 DIAGNOSIS — E349 Endocrine disorder, unspecified: Secondary | ICD-10-CM

## 2019-01-03 DIAGNOSIS — O3680X0 Pregnancy with inconclusive fetal viability, not applicable or unspecified: Secondary | ICD-10-CM

## 2019-01-03 DIAGNOSIS — Z712 Person consulting for explanation of examination or test findings: Secondary | ICD-10-CM

## 2019-01-03 NOTE — Progress Notes (Signed)
Chart reviewed - agree with RN documentation.   

## 2019-01-03 NOTE — Progress Notes (Signed)
Patient presents to office today for viability ultrasound results. Reviewed results with Dr Nehemiah Settle who finds single living IUP with [facility]- patient should begin prenatal care.   Informed patient of all results, reviewed dating, & provided pictures. Advised she begin OB care. Patient verbalized understanding & had no questions.  Koren Bound RN BSN 01/03/19

## 2019-01-03 NOTE — Progress Notes (Deleted)
Pt presents to office today following ultrasound for results.

## 2019-01-24 ENCOUNTER — Ambulatory Visit (INDEPENDENT_AMBULATORY_CARE_PROVIDER_SITE_OTHER): Payer: Medicaid Other | Admitting: *Deleted

## 2019-01-24 ENCOUNTER — Other Ambulatory Visit: Payer: Self-pay

## 2019-01-24 DIAGNOSIS — O0990 Supervision of high risk pregnancy, unspecified, unspecified trimester: Secondary | ICD-10-CM | POA: Insufficient documentation

## 2019-01-24 DIAGNOSIS — Z3400 Encounter for supervision of normal first pregnancy, unspecified trimester: Secondary | ICD-10-CM

## 2019-01-24 MED ORDER — BLOOD PRESSURE MONITOR AUTOMAT DEVI: 1.0000 | Freq: Every day | 0 refills | Status: DC

## 2019-01-24 NOTE — Progress Notes (Signed)
  Virtual Visit via Telephone Note  I connected with Michelle Gallagher on 01/24/19 at 10:30 AM EDT by telephone and verified that I am speaking with the correct person using two identifiers.  Location: Patient: Michelle Gallagher MRN: [id number] Provider: Derl Barrow, RN   I discussed the limitations, risks, security and privacy concerns of performing an evaluation and management service by telephone and the availability of in person appointments. I also discussed with the patient that there may be a patient responsible charge related to this service. The patient expressed understanding and agreed to proceed.   History of Present Illness: PRENATAL INTAKE SUMMARY  Michelle Gallagher presents today New OB Nurse Interview.  OB History    Gravida  1   Para  0   Term  0   Preterm  0   AB  0   Living  0     SAB  0   TAB  0   Ectopic  0   Multiple  0   Live Births  0          I have reviewed the patient's medical, obstetrical, social, and family histories, medications, and available lab results.  SUBJECTIVE She has no unusual complaints   Observations/Objective: Initial nurse interview for history/labs (New OB)  EDD: 08/25/2019 by early ultrasound GA: [redacted]w[redacted]d G1P0 FHT: non face to face  GENERAL APPEARANCE: non face to face  Assessment and Plan: Normal pregnancy Prenatal care- [facility]/physical to be completed at next visit Sign up for Babyscripts. Rx for BP cuff sent to pharmacy Pt to pick up Rx for PNV from pharmacy  Follow Up Instructions:   I discussed the assessment and treatment plan with the patient. The patient was provided an opportunity to ask questions and all were answered. The patient agreed with the plan and demonstrated an understanding of the instructions.   The patient was advised to call back or seek an in-person evaluation if the symptoms worsen or if the condition fails to improve as anticipated.  I provided 10 minutes of  non-face-to-face time during this encounter.   Derl Barrow, RN

## 2019-02-04 ENCOUNTER — Ambulatory Visit (INDEPENDENT_AMBULATORY_CARE_PROVIDER_SITE_OTHER): Payer: Medicaid Other | Admitting: Obstetrics & Gynecology

## 2019-02-04 ENCOUNTER — Other Ambulatory Visit: Payer: Self-pay

## 2019-02-04 ENCOUNTER — Other Ambulatory Visit (HOSPITAL_COMMUNITY)
Admission: RE | Admit: 2019-02-04 | Discharge: 2019-02-04 | Disposition: A | Discharge status: Home / Self Care | Payer: Medicaid Other | Source: Ambulatory Visit | Attending: Obstetrics & Gynecology | Admitting: Obstetrics & Gynecology

## 2019-02-04 VITALS — BP 129/84 | HR 91 | Temp 98.6°F | Wt 289.6 lb

## 2019-02-04 DIAGNOSIS — Z23 Encounter for immunization: Secondary | ICD-10-CM

## 2019-02-04 DIAGNOSIS — Z01419 Encounter for gynecological examination (general) (routine) without abnormal findings: Secondary | ICD-10-CM | POA: Diagnosis present

## 2019-02-04 DIAGNOSIS — Z124 Encounter for screening for malignant neoplasm of cervix: Secondary | ICD-10-CM

## 2019-02-04 DIAGNOSIS — Z3A11 11 weeks gestation of pregnancy: Secondary | ICD-10-CM | POA: Diagnosis not present

## 2019-02-04 DIAGNOSIS — O99211 Obesity complicating pregnancy, first trimester: Secondary | ICD-10-CM

## 2019-02-04 DIAGNOSIS — O99210 Obesity complicating pregnancy, unspecified trimester: Secondary | ICD-10-CM

## 2019-02-04 DIAGNOSIS — Z3400 Encounter for supervision of normal first pregnancy, unspecified trimester: Secondary | ICD-10-CM | POA: Diagnosis present

## 2019-02-04 DIAGNOSIS — Z3401 Encounter for supervision of normal first pregnancy, first trimester: Secondary | ICD-10-CM | POA: Diagnosis not present

## 2019-02-04 NOTE — Progress Notes (Signed)
  Subjective:    Michelle Gallagher is being seen today for her first obstetrical visit.G1P0 LMP 11/11/2018 reg.    This is not a planned pregnancy. She is at [redacted]w[redacted]d gestation. Her obstetrical history is significant for obesity. Relationship with FOB: not in relationship. . Patient does intend to breast feed. Pregnancy history fully reviewed.  Patient reports no complaints.  Review of Systems:   Review of Systems  Objective:  BP 129/84   Pulse 91   Temp 98.6 F (37 C)   Wt 289 lb 9.6 oz (131.4 kg)   LMP 11/11/2018 (Exact Date)   BMI 48.19 kg/m   Physical Exam  Exam General Appearance:    Alert, cooperative, no distress, appears stated age  Head:    Normocephalic, without obvious abnormality, atraumatic  Eyes:    conjunctiva/corneas clear, EOM's intact, both eyes  Ears:    Normal external ear canals, both ears  Nose:   Nares normal, septum midline, mucosa normal, no drainage    or sinus tenderness  Throat:   Lips, mucosa, and tongue normal; teeth and gums normal  Neck:   Supple, symmetrical, trachea midline, no adenopathy;    thyroid:  no enlargement/tenderness/nodules  Back:     Symmetric, no curvature, ROM normal, no CVA tenderness  Lungs:     respirations unlabored  Chest Wall:    No tenderness or deformity   Heart:    Regular rate and rhythm  Breast Exam:    No tenderness, masses, or nipple abnormality  Abdomen:     Soft, non-tender, bowel sounds active all four quadrants,    no masses, no organomegaly  Genitalia:    Normal female without lesion, discharge or tenderness     Extremities:   Extremities normal, atraumatic, no cyanosis or edema  Pulses:   2+ and symmetric all extremities  Skin:   Skin color, texture, turgor normal, no rashes or lesions     Assessment:    Pregnancy: G1P0000 Patient Active Problem List   Diagnosis Date Noted  . Obesity affecting pregnancy, antepartum 02/04/2019  . Supervision of normal first pregnancy, antepartum 01/24/2019       Plan:      Initial labs drawn. Prenatal vitamins. Problem list reviewed and updated. NIPTS discussed: requested. Role of ultrasound in pregnancy discussed; fetal survey: requested. Amniocentesis discussed: not indicated. Follow up in 5 weeks. 70% of 30 min visit spent on counseling and coordination of care.  AFP next visit  Lavonia Drafts 02/04/2019

## 2019-02-05 LAB — OBSTETRIC PANEL, INCLUDING HIV
Antibody Screen: NEGATIVE
Basophils Absolute: 0 10*3/uL (ref 0.0–0.2)
Basos: 0 %
EOS (ABSOLUTE): 0 10*3/uL (ref 0.0–0.4)
Eos: 1 %
HIV Screen 4th Generation wRfx: NONREACTIVE
Hematocrit: 34.9 % (ref 34.0–46.6)
Hemoglobin: 11 g/dL — ABNORMAL LOW (ref 11.1–15.9)
Hepatitis B Surface Ag: NEGATIVE
Immature Grans (Abs): 0 10*3/uL (ref 0.0–0.1)
Immature Granulocytes: 0 %
Lymphocytes Absolute: 3.2 10*3/uL — ABNORMAL HIGH (ref 0.7–3.1)
Lymphs: 43 %
MCH: 21.6 pg — ABNORMAL LOW (ref 26.6–33.0)
MCHC: 31.5 g/dL (ref 31.5–35.7)
MCV: 68 fL — ABNORMAL LOW (ref 79–97)
Monocytes Absolute: 0.7 10*3/uL (ref 0.1–0.9)
Monocytes: 10 %
Neutrophils Absolute: 3.5 10*3/uL (ref 1.4–7.0)
Neutrophils: 46 %
Platelets: 256 10*3/uL (ref 150–450)
RBC: 5.1 x10E6/uL (ref 3.77–5.28)
RDW: 19.8 % — ABNORMAL HIGH (ref 11.7–15.4)
RPR Ser Ql: NONREACTIVE
Rh Factor: POSITIVE
Rubella Antibodies, IGG: 1.78 index (ref >0.99)
WBC: 7.5 10*3/uL (ref 3.4–10.8)

## 2019-02-05 LAB — HEMOGLOBIN A1C
Est. average glucose Bld gHb Est-mCnc: 114 mg/dL
Hgb A1c MFr Bld: 5.6 % (ref 4.8–5.6)

## 2019-02-06 LAB — CULTURE, OB URINE

## 2019-02-06 LAB — URINE CULTURE, OB REFLEX

## 2019-02-07 ENCOUNTER — Encounter: Payer: Self-pay | Admitting: General Practice

## 2019-02-07 LAB — CERVICOVAGINAL ANCILLARY ONLY
Bacterial Vaginitis (gardnerella): POSITIVE — AB
Candida Glabrata: NEGATIVE
Candida Vaginitis: NEGATIVE
Chlamydia: NEGATIVE
Comment: NEGATIVE
Comment: NEGATIVE
Comment: NEGATIVE
Comment: NEGATIVE
Comment: NEGATIVE
Comment: NORMAL
Neisseria Gonorrhea: NEGATIVE
Trichomonas: NEGATIVE

## 2019-02-08 LAB — CYTOLOGY - PAP: Diagnosis: NEGATIVE

## 2019-02-16 ENCOUNTER — Encounter: Payer: Self-pay | Admitting: General Practice

## 2019-02-28 ENCOUNTER — Other Ambulatory Visit: Payer: Self-pay | Admitting: Obstetrics & Gynecology

## 2019-03-08 ENCOUNTER — Other Ambulatory Visit: Payer: Self-pay | Admitting: Obstetrics & Gynecology

## 2019-03-08 DIAGNOSIS — N760 Acute vaginitis: Secondary | ICD-10-CM

## 2019-03-08 DIAGNOSIS — B9689 Other specified bacterial agents as the cause of diseases classified elsewhere: Secondary | ICD-10-CM

## 2019-03-08 MED ORDER — METRONIDAZOLE 500 MG PO TABS: 500.0000 mg | ORAL_TABLET | Freq: Two times a day (BID) | ORAL | 0 refills | Status: DC

## 2019-03-09 ENCOUNTER — Ambulatory Visit (INDEPENDENT_AMBULATORY_CARE_PROVIDER_SITE_OTHER): Payer: Medicaid Other | Admitting: Certified Nurse Midwife

## 2019-03-09 ENCOUNTER — Other Ambulatory Visit: Payer: Self-pay

## 2019-03-09 ENCOUNTER — Encounter: Payer: Self-pay | Admitting: Certified Nurse Midwife

## 2019-03-09 VITALS — BP 106/64 | HR 105 | Temp 97.6°F | Wt 281.8 lb

## 2019-03-09 DIAGNOSIS — Z3400 Encounter for supervision of normal first pregnancy, unspecified trimester: Secondary | ICD-10-CM

## 2019-03-09 DIAGNOSIS — Z3A15 15 weeks gestation of pregnancy: Secondary | ICD-10-CM

## 2019-03-09 DIAGNOSIS — O99212 Obesity complicating pregnancy, second trimester: Secondary | ICD-10-CM

## 2019-03-09 DIAGNOSIS — O99210 Obesity complicating pregnancy, unspecified trimester: Secondary | ICD-10-CM

## 2019-03-09 MED ORDER — PREPLUS 27-1 MG PO TABS: 1.0000 | ORAL_TABLET | Freq: Every day | ORAL | 6 refills | Status: DC

## 2019-03-09 NOTE — Patient Instructions (Signed)

## 2019-03-09 NOTE — Progress Notes (Signed)
   PRENATAL VISIT NOTE  Subjective:  Michelle Gallagher is a 23 y.o. G1P0000 at [redacted]w[redacted]d being seen today for ongoing prenatal care.  She is currently monitored for the following issues for this low-risk pregnancy and has Supervision of normal first pregnancy, antepartum and Obesity affecting pregnancy, antepartum on their problem list.  Patient reports no complaints.  Contractions: Not present. Vag. Bleeding: None.  Movement: Present. Denies leaking of fluid.   The following portions of the patient's history were reviewed and updated as appropriate: allergies, current medications, past family history, past medical history, past social history, past surgical history and problem list.   Objective:   Vitals:   03/09/19 1401  BP: 106/64  Pulse: (!) 105  Temp: 97.6 F (36.4 C)  Weight: 281 lb 12.8 oz (127.8 kg)    Fetal Status: Fetal Heart Rate (bpm): 161   Movement: Present     General:  Alert, oriented and cooperative. Patient is in no acute distress.  Skin: Skin is warm and dry. No rash noted.   Cardiovascular: Normal heart rate noted  Respiratory: Normal respiratory effort, no problems with respiration noted  Abdomen: Soft, gravid, appropriate for gestational age.  Pain/Pressure: Absent     Pelvic: Cervical exam deferred        Extremities: Normal range of motion.  Edema: None  Mental Status: Normal mood and affect. Normal behavior. Normal judgment and thought content.   Assessment and Plan:  Pregnancy: G1P0000 at [redacted]w[redacted]d 1. Supervision of normal first pregnancy, antepartum - Patient doing well, no complaints  - Educated and discussed fetal movement and what to expect around 18-20 weeks - patient reports feeling flutters now  - Anticipatory guidance on upcoming appointments with anatomy US around 19 weeks  - Patient reports she is having a gender reveal and does not want to know sex of fetus but sisters are planning reveal - Discussed results of initial genetic screening, discussed  collecting AFP today for second part of screening  - Patient request PNV Rx to be sent to [place], patient is planning on picking up medication for BV today  - AFP TETRA - [place] MFM OB COMP + 14 WK; Future - Prenatal Vit-Fe Fumarate-FA (PREPLUS) 27-1 MG TABS; Take 1 tablet by mouth daily.  Dispense: 60 tablet; Refill: 6  2. Obesity affecting pregnancy, antepartum - [place] MFM OB COMP + 14 WK; Future  Preterm labor symptoms and general obstetric precautions including but not limited to vaginal bleeding, contractions, leaking of fluid and fetal movement were reviewed in detail with the patient. Please refer to After Visit Summary for other counseling recommendations.   Return in about 4 weeks (around 04/06/2019) for ROB- mychart.  Future Appointments  Date Time Provider [facility]  04/05/2019  2:00 PM [place] 3 [facility]  04/06/2019  2:30 PM [place], CNM [facility] None    Lajean Manes, CNM

## 2019-03-11 LAB — AFP TETRA
DIA Mom Value: 3.33
DIA Value (EIA): 379.67 pg/mL
DSR (By Age)    1 IN: 1106
DSR (Second Trimester) 1 IN: 11
Gestational Age: 16.9 WEEKS
MSAFP Mom: 0.75
MSAFP: 21.3 ng/mL
MSHCG Mom: 3.41
MSHCG: 77456 m[IU]/mL
Maternal Age At EDD: 22.9 yr
Osb Risk: 10000
T18 (By Age): 1:4307 {titer}
Test Results:: POSITIVE — AB
Weight: 289 [lb_av]
uE3 Mom: 0.6
uE3 Value: 0.6 ng/mL

## 2019-03-14 ENCOUNTER — Telehealth: Payer: Self-pay | Admitting: *Deleted

## 2019-03-14 NOTE — Telephone Encounter (Signed)
-----   Message from Lajean Manes, CNM sent at 03/14/2019 11:33 AM EST ----- Can you please contact patient and notify of positive AFP results - patient also needs genetic counseling appointment added to anatomy US.

## 2019-03-15 ENCOUNTER — Other Ambulatory Visit: Payer: Self-pay | Admitting: Certified Nurse Midwife

## 2019-03-15 DIAGNOSIS — O280 Abnormal hematological finding on antenatal screening of mother: Secondary | ICD-10-CM | POA: Insufficient documentation

## 2019-04-05 ENCOUNTER — Ambulatory Visit (HOSPITAL_COMMUNITY): Payer: Medicaid Other

## 2019-04-06 ENCOUNTER — Telehealth (INDEPENDENT_AMBULATORY_CARE_PROVIDER_SITE_OTHER): Payer: Medicaid Other | Admitting: Advanced Practice Midwife

## 2019-04-06 ENCOUNTER — Encounter: Payer: Self-pay | Admitting: Advanced Practice Midwife

## 2019-04-06 DIAGNOSIS — O99213 Obesity complicating pregnancy, third trimester: Secondary | ICD-10-CM

## 2019-04-06 DIAGNOSIS — O99210 Obesity complicating pregnancy, unspecified trimester: Secondary | ICD-10-CM

## 2019-04-06 DIAGNOSIS — Z3A36 36 weeks gestation of pregnancy: Secondary | ICD-10-CM

## 2019-04-06 DIAGNOSIS — Z3400 Encounter for supervision of normal first pregnancy, unspecified trimester: Secondary | ICD-10-CM

## 2019-04-06 NOTE — Progress Notes (Signed)
   TELEHEALTH VIRTUAL OBSTETRICS VISIT ENCOUNTER NOTE  I connected with Michelle Gallagher on 04/06/19 at  2:30 PM EST by telephone at home and verified that I am speaking with the correct person using two identifiers.   I discussed the limitations, risks, security and privacy concerns of performing an evaluation and management service by telephone and the availability of in person appointments. I also discussed with the patient that there may be a patient responsible charge related to this service. The patient expressed understanding and agreed to proceed.  Subjective:  Michelle Gallagher is a 23 y.o. G1P0000 at [redacted]w[redacted]d being followed for ongoing prenatal care.  She is currently monitored for the following issues for this low-risk pregnancy and has Supervision of normal first pregnancy, antepartum; Obesity affecting pregnancy, antepartum; and Abnormal antenatal AFP screen on their problem list.  Patient reports no complaints. Reports fetal movement. Denies any contractions, bleeding or leaking of fluid.   The following portions of the patient's history were reviewed and updated as appropriate: allergies, current medications, past family history, past medical history, past social history, past surgical history and problem list.   Objective:   General:  Alert, oriented and cooperative.   Mental Status: Normal mood and affect perceived. Normal judgment and thought content.  Rest of physical exam deferred due to type of encounter  140/83 118/67 Assessment and Plan:  Pregnancy: G1P0000 at [redacted]w[redacted]d 1. Obesity affecting pregnancy, antepartum  2. Supervision of normal first pregnancy, antepartum - Elevated blood pressure today, but was normal on re-check  Preterm labor symptoms and general obstetric precautions including but not limited to vaginal bleeding, contractions, leaking of fluid and fetal movement were reviewed in detail with the patient.  I discussed the assessment and treatment plan with  the patient. The patient was provided an opportunity to ask questions and all were answered. The patient agreed with the plan and demonstrated an understanding of the instructions. The patient was advised to call back or seek an in-person office evaluation/go to [facility]U at [facility] for any urgent or concerning symptoms. Please refer to After Visit Summary for other counseling recommendations.   I provided 10 minutes of non-face-to-face time during this encounter.  Return in about 4 weeks (around 05/04/2019).  Future Appointments  Date Time Provider [place]  04/11/2019  1:30 PM WH-MFC [place] 1 WH-MFCUS MFC-US  04/11/2019  1:35 PM [place] NURSE [place] MFC-US  04/11/2019  2:30 PM [place] GENETIC COUNSELING RM [place] MFC-US    Marcille Buffy DNP, CNM  04/06/19  2:45 PM  [facility] for [facility]

## 2019-04-08 NOTE — L&D Delivery Note (Signed)
OB/GYN Faculty Practice Delivery Note  Michelle Gallagher is a 24 y.o. G1P0000 s/p VAVD at [redacted]w[redacted]d. She was admitted for SOL and found to have mild Pre-E.   ROM: 16h 34m with moderate meconium-stained fluid GBS Status: Negative/-- (04/22 1031) Maximum Maternal Temperature: 99.66F  Labor Progress: . Initial SVE: 5/80/-2. Patient received AROM and Pitocin. She then progressed to complete.   Delivery Date/Time: 5/12 @ 0459 Delivery:  Indication for operative vaginal delivery: NRFHT, Maternal Fatigue, Pushing for 2+ hours and complete for about 6 hours with minimal descent, Occiput Posterior position  Patient was examined and found to be fully dilated with fetal station of +2.  Patient's bladder was noted to be empty, and there were no known fetal contraindications to operative vaginal delivery. EFW was 3600 by [place]/recent ultrasound.  FHR tracing remarkable for late, prolonged and variable decelerations with elevated FHR baseline to 160's.  Risks of vacuum assistance were discussed in detail, including but not limited to, bleeding, infection, damage to maternal tissues, fetal cephalohematoma, inability to effect vaginal delivery of the head or shoulder dystocia that cannot be resolved by established maneuvers and need for emergency cesarean section.  Patient gave verbal consent.  The soft Kiwi vacuum cup was positioned over the sagittal suture 3 cm anterior to posterior fontanelle.  Pressure was then increased to 500 mmHg, and the patient was instructed to push.  Pulling was administered along the pelvic curve while patient was pushing; there were 5 contractions and 0 popoffs.  Vacuum was reduced in between contractions.  The infant was then delivered atraumatically from LOP position (restituted to LOA). No nuchal cord present. Shoulders and body easily followed and placed on maternal abdomen; cord cut and clamped prior to 1 minute due to lack of spontaneous cry. Arterial cord gas drawn. Neonatology  present for delivery.  There was spontaneous placental delivery, intact with three-vessel cord.  Type 3c degree perineal laceration noted requiring repair with 2-0 and 3-0 Vicryl in the usual fashion. Sponge, instrument and needle counts were correct x 2.  The patient and baby were stable after delivery and remained in couplet care, with plans to transfer later to postpartum unit.  Baby Weight: pending  Placenta: Sent to L&D Complications: Vacuum assisted and 3c perineal laceration Lacerations: 3c perineal EBL: 842 mL Analgesia: Epidural and local lidocaine   Infant:  APGAR (1 MIN): 7   APGAR (5 MINS): 9   APGAR (10 MINS):     Jerilynn Birkenhead, MD OB Family Medicine Fellow, [facility] for [facility] 08/17/2019, 5:44 AM

## 2019-04-11 ENCOUNTER — Encounter (HOSPITAL_COMMUNITY): Payer: Self-pay

## 2019-04-11 ENCOUNTER — Ambulatory Visit (HOSPITAL_COMMUNITY): Payer: Self-pay | Admitting: Genetic Counselor

## 2019-04-11 ENCOUNTER — Ambulatory Visit (HOSPITAL_COMMUNITY)
Admission: RE | Admit: 2019-04-11 | Discharge: 2019-04-11 | Disposition: A | Discharge status: Home / Self Care | Payer: Medicaid Other | Source: Ambulatory Visit | Attending: Certified Nurse Midwife | Admitting: Certified Nurse Midwife

## 2019-04-11 ENCOUNTER — Other Ambulatory Visit: Payer: Self-pay

## 2019-04-11 ENCOUNTER — Ambulatory Visit (HOSPITAL_BASED_OUTPATIENT_CLINIC_OR_DEPARTMENT_OTHER): Payer: Medicaid Other | Admitting: Genetic Counselor

## 2019-04-11 ENCOUNTER — Other Ambulatory Visit (HOSPITAL_COMMUNITY): Payer: Self-pay | Admitting: *Deleted

## 2019-04-11 ENCOUNTER — Ambulatory Visit (HOSPITAL_COMMUNITY): Payer: Medicaid Other | Admitting: *Deleted

## 2019-04-11 DIAGNOSIS — D563 Thalassemia minor: Secondary | ICD-10-CM | POA: Diagnosis present

## 2019-04-11 DIAGNOSIS — O99212 Obesity complicating pregnancy, second trimester: Secondary | ICD-10-CM

## 2019-04-11 DIAGNOSIS — O99210 Obesity complicating pregnancy, unspecified trimester: Secondary | ICD-10-CM | POA: Insufficient documentation

## 2019-04-11 DIAGNOSIS — Z3A20 20 weeks gestation of pregnancy: Secondary | ICD-10-CM

## 2019-04-11 DIAGNOSIS — O280 Abnormal hematological finding on antenatal screening of mother: Secondary | ICD-10-CM | POA: Diagnosis present

## 2019-04-11 DIAGNOSIS — Z148 Genetic carrier of other disease: Secondary | ICD-10-CM | POA: Diagnosis not present

## 2019-04-11 DIAGNOSIS — Z315 Encounter for genetic counseling: Secondary | ICD-10-CM

## 2019-04-11 DIAGNOSIS — O289 Unspecified abnormal findings on antenatal screening of mother: Secondary | ICD-10-CM

## 2019-04-11 DIAGNOSIS — Z3400 Encounter for supervision of normal first pregnancy, unspecified trimester: Secondary | ICD-10-CM

## 2019-04-11 DIAGNOSIS — O281 Abnormal biochemical finding on antenatal screening of mother: Secondary | ICD-10-CM | POA: Diagnosis present

## 2019-04-11 DIAGNOSIS — O368390 Maternal care for abnormalities of the fetal heart rate or rhythm, unspecified trimester, not applicable or unspecified: Secondary | ICD-10-CM

## 2019-04-11 NOTE — Progress Notes (Addendum)
04/11/2019  Michelle Gallagher [birth year] MRN: [id number] DOV: 04/11/2019  Michelle Gallagher presented to the [facility] for a genetics consultation regarding her abnormal quad screen results and her carrier status for alpha-thalassemia. Michelle Gallagher came to her appointment alone due to COVID-19 visitor restrictions.   Indication for genetic counseling - Increased risk for trisomy 47 (Down syndrome) onquad screen - Silent carrier for alpha-thalassemia  Prenatal history  Michelle Gallagher is a G1P0000, 24 y.o. female. Her current pregnancy has completed [redacted]w[redacted]d (Estimated Date of Delivery: 08/25/19).  Michelle Gallagher denied exposure to environmental toxins or chemical agents. She denied the use of alcohol or street drugs. She reported smoking one cigarette per day, down from one pack per day. She is trying to quit smoking entirely. She reported taking prenatal vitamins. She denied significant viral illnesses, fevers, and bleeding during the course of her pregnancy. Her medical and surgical histories were noncontributory.  Prenatal exposure to tobacco can increase the risk for placenta previa and placental abruption, preterm birth, low birth weight, oral clefts, stillbirth, and sudden infant death syndrome (SIDS). Prenatal exposure to nicotine can also damage the developing fetal brain and lungs. The risk of pregnancy complications associated with cigarette exposure tends to increase with the amount of cigarettes a woman smokes. I praised Michelle Gallagher's efforts to reduce the amount of cigarettes she smokes and encouraged her to continue working towards her goal of quitting smoking altogether.   Family History  A three generation pedigree was drafted and reviewed. The family history is remarkable for the following:  - Michelle Gallagher was reportedly born with a "hole in her heart" that closed spontaneously. The exact etiology of this congenital heart defect (CHD) is unknown and records are  not available. There are multifactorial causes for isolated CHDs, including environmental and genetic factors. As such, the recurrence risk for first degree relatives is elevated over the general population incidence of 1/200 (0.5%). We discussed that without knowing the exact etiology, the risk for a child of an affected mother is approximately 5-6%. A fetal echocardiogram to assess for CHDs in the current pregnancy may be recommended.  - Michelle Gallagher's mother was diagnosed with multiple sclerosis (MS) in her early 3s. Michelle Gallagher has a maternal aunt whose daughter has type I diabetes. We discussed that these are both examples of conditions known as autoimmune conditions. Autoimmune conditions occur when an individual's body launches an abnormal immune response and begins to destroy its own cells. There are several different types of autoimmune conditions. While we do know that autoimmune conditions tend to "cluster" within a family, they do not follow a clear pattern of inheritance. When there is a person in the family with an autoimmune condition, there is an increased chance for others in the family to develop an autoimmune condition, but it may be a different condition. Specific risk factor information is not available. Genetic testing is not available at this time to predict who may develop an autoimmune condition.  - Michelle Gallagher has a paternal half sister who has speech delay. She is 3 years old and does not speak. Her medical and developmental history is otherwise noncontributory. We discussed that many times, developmental delays like speech delay are multifactorial in nature, occurring due to a combination of genetic and environmental factors that are difficult to identify. Developmental delays can appear to run in families; thus, there is a chance that the Michelle Gallagher's children could also experience developmental delays of some kind. She  understands that she should make the pediatrician aware of  any concerns she has about her children's development.  The remaining family histories were reviewed and found to be noncontributory for birth defects, intellectual disability, recurrent pregnancy loss, and known genetic conditions. Michelle Gallagher had limited information about the father of the pregnancy's family history as the couple is separated; thus, risk assessment was limited.  The patient's ethnicity is African American. The father of the pregnancy's ethnicity is African American. Ashkenazi Jewish ancestry and consanguinity were denied. Pedigree will be scanned under Media.  Discussion  Michelle Gallagher was referred for genetic counseling due to quad screening that was high risk for Down syndrome. We reviewed that Michelle Gallagher initially had Panorama NIPS through the laboratory [facility] in October that was low-risk for fetal aneuploidies. We reviewed that these results showed a less than 1 in 10,000 risk for trisomies 5321 (Down syndrome), 18 and 13, and monosomy X (Turner syndrome). In addition, the risk for triploidy and sex chromosome trisomies (47,XXX and 47,XXY) was also low. Michelle Gallagher elected to have cfDNA analysis for 22q11.2 deletion syndrome, which was also low risk (1 in 2900).   After [facility] HazyPanorama, Michelle Gallagher had quad screening performed. Results from quad screening came back positive for an increased risk of Down syndrome in the current pregnancy. Based on results from quad screening, the risk for Down syndrome in the current pregnancy increased from Michelle Gallagher's 1 in 1106 age-related risk to 1 in 11 (~9%). Additionally, the risk for trisomy 18 in the current pregnancy was not increased above her 1 in 4307 age-related risk, and the risk for ONTDs in the pregnancy was low (1 in 10,000). Of note, hCG and inhibin were both markedly increased on Michelle Gallagher's quad screen (3.41 and 3.33 MoM respectively).  Down syndrome is one of the most common extra chromosome conditions, as approximately 1 in  800 babies are born with this condition. There are different types of Down syndrome, with each type determined by the arrangement of the chromosome 21 pair. Approximately 95% of cases are caused by an entire extra copy of chromosome 21 (trisomy 21), and 2-4% of cases are due to a chromosomal rearrangement (translocation) involving chromosome 21. Down syndrome most commonly occurs by chance due to an error in chromosomal division during the formation of egg and sperm cells in a process called nondisjunction.   Down syndrome is characterized by a distinctive facial appearance, mild to moderate intellectual disability, and an increased chance for a heart defect. Approximately half of babies with Down syndrome are born with a heart defect that may require surgery after birth. While many children with Down syndrome look similar to each other, each child with Down syndrome is unique and will have many more features in common with his or her own family members. Children with Down syndrome also have an increased chance for thyroid problems, which can range from an underactive to an overactive thyroid. Additionally, low muscle tone, vision problems, and respiratory and ear infections are more common among babies with Down syndrome. There are many more features that can be associated with Down syndrome; however, it is not possible to accurately predict all features that would be present in an individual with Down syndrome prenatally. Additionally, there is a high degree of variability seen among children who have this condition, meaning that every child with Down syndrome will not be affected in exactly the same way, and some children will have more or less features than others.  Ms.  Schmoll was counseled on the difference between NIPS and quad screening. Firstly, we reviewed what the screens actually measure. NIPS analyzes cell free DNA originating from the placenta that is found in the maternal blood circulation during  pregnancy. Generally, the placenta's chromosomal makeup is representative of the fetus, as the two originate from the same sperm and egg cell. However, in about 1-2% of pregnancies, the placenta differs  chromosomally from the fetus. This phenomenon is referred to as confined placental mosaicism. NIPS is not diagnostic for chromosome conditions, but can provide information regarding the presence or absence of extra fetal/placental DNA for chromosomes 13, 18, 21, and the sex chromosomes. The reported detection rate for NIPS is 91-99% for trisomies 21, 40, 58, and sex chromosome aneuploidies. The false positive rate for NIPS is reported to be less than 0.1% for any of these conditions. The false negative rate for Panorama NIPS is reported to be 0.6%. NIPS cannot assess for open neural tube defects (ONTDs). It is recommended that when women receive a low-risk NIPS result, MS-AFP screening, not another form of serum screening such as quad screening, be ordered in the second trimester to assess the risk of ONTDs in a pregnancy. Quad screening is another form of screening for chromosomal abnormalities. Instead of utilizing fetal DNA, quad screening measures levels of four hormones in a pregnant woman's blood. These hormones are AFP, hCG, inhibin (DIA), and unconjugated estriol (uE3). The levels of these hormones can help identify if a pregnancy is at high risk for trisomy 35 (Down syndrome), trisomy 18, and ONTDs. Quad screening detects 75-80% of cases of Down syndrome, 73% of cases of trisomy 18, and 80-90% of ONTDs. The reported false positive rate for quad screening is 5%. It cannot assess for the risk of trisomy 45 or sex chromosome aneuploidies in a pregnancy. Neither NIPS nor quad screen are diagnostic for fetal chromosomal aneuploidies.   In pregnancies where a fetus has Down syndrome, the level of hCG and inhibin on quad screening are often high. Since the levels of hCG and inhibin detected on Ms. Spiker's  quad screen were elevated, the pregnancy screened positive for Down syndrome. However, Ms. Schurman's initial NIPS result determined that the pregnancy was low-risk for Down syndrome (<1 in 10,000 risk). Given that quad screening has a higher false positive rate than the false negative rate of NIPS, and the fact that NIPS has a greater sensitivity for detecting Down syndrome than quad screening, the result from NIPS is more accurate. Additionally, increased levels of hCG and inhibin can be associated with other abnormalities aside from Down syndrome, including adverse obstetrical outcomes such as preeclampsia, preterm delivery, fetal growth restriction, and fetal loss. This could explain the contradictory risk estimates for Down syndrome from each screen. Ms. Funderburk was counseled that the pregnancy is likely low-risk for Down syndrome, but may be at risk for other adverse obstetrical outcomes.   Ms. Torpey also had Horizon-14 carrier screening performed through [place]. The results of the screen identified her as a silent carrier for alpha-thalassemia (aa/a-). Alpha-thalassemia is different in its inheritance compared to other hemoglobinopathies as there are two copies of two alpha globin genes (HBA1 and HBA2) on each chromosome 16, or four alpha globin genes total (aa/aa). A person can be a carrier of one alpha gene mutation (aa/a-), also referred to as a "silent carrier". A person who carries two alpha globin gene mutations can either carry them in cis (both on the same chromosome, denoted as aa/--) or in  trans (on different chromosomes, denoted as a-/a-). Alpha-thalassemia carriers of two mutations who have African American ancestry are more likely to have a trans arrangement (a-/a-); cis configuration is reported to be rare in individuals with African American ancestry.     There are several different forms of alpha-thalassemia. The most severe form of alpha-thalassemia, Hb Barts, is associated with an  absence of alpha globin chain synthesis as a result of deletions of all four alpha globin genes (--/--).  Given that Ms. Belitz is a silent carrier (aa/a-), her pregnancies would not be at increased risk for Hb Barts, even if the father of the pregnancy is a carrier for alpha-thalassemia, as she will always pass on at least one copy of the alpha globin gene to her children. Hemoglobin H (HbH) disease is caused by three deleted or dysfunctioning alpha globin alleles (a-/--) and is characterized by microcytic hypochromic hemolytic anemia, hepatosplenomegaly, mild jaundice, growth retardation, and sometimes thalassemia-like bone changes. Given Ms. Cabler's silent carrier status (aa/a-), the current fetus would only be at risk for HbH disease (a-/--), if father of the pregnancy is a carrier for two alpha globin mutations in cis (aa/--). If this is the case, the risk for HbH disease in the pregnancy would be 1 in 4 (25%). However, if father of the pregnancy is a carrier for two alpha globin mutations, he would be more likely to carry them in trans configuration (a-/a-) than the cis configuration (aa/--), given his ethnicity. If he is a carrier of alpha-thalassemia in trans, then the pregnancy would not be at increased risk for HbH disease. Based on the carrier frequency for alpha-thalassemia in the African American population, father of the pregnancy has a 1 in 30 chance of being any type of carrier for alpha-thalassemia.   Ms. Freundlich carrier screening was negative for the other 13 conditions screened. Thus, her risk to be a carrier for these additional conditions (listed separately in the laboratory report) has been reduced but not eliminated. This also significantly reduces her risk of having a child affected by one of these conditions. We discussed that carrier testing for alpha-thalassemia is recommended for father of the pregnancy. Ms. Klinke indicated that she may be interested in pursuing partner  carrier screening.  A complete ultrasound was performed today prior to our visit. The ultrasound report will be sent under separate cover. There were no visualized fetal anomalies or markers suggestive of aneuploidy, though fetal arrhythmia was noted. Ms. Lovelady was counseled that approximately 50% of fetuses with Down syndrome demonstrate a sign of the condition on anatomy ultrasound. For this reason, a normal ultrasound decreases the suspicion for trisomy 21 in the pregnancy. Given the family history and finding of fetal arrhyhtmia, fetal echocardiogram may be recommended if fetal arrhythmia continues to be noted at her next exam.  Ms. Morency was also counseled regarding diagnostic testing via amniocentesis. We discussed the technical aspects of the procedure and quoted up to a 1 in 500 (0.2%) risk for spontaneous pregnancy loss or other adverse pregnancy outcomes as a result of amniocentesis. Cultured cells from an amniocentesis sample allow for the visualization of a fetal karyotype, which can detect >99% of chromosomal aberrations. Chromosomal microarray can also be performed to identify smaller deletions or duplications of fetal chromosomal material. Amniocentesis could also be performed to assess whether the baby is affected by alpha-thalassemia. MichelleCoppadgewas informed that diagnostic testing would be the only way todefinitively determineif the fetus hasDown syndrome or alpha-thalassemia prenatally.MichelleConkey was also made aware that  she has the option of continuing with standard ultrasounds and pursuing genetic testing postnatally if clinically indicated. After careful consideration, Ms. Perkovich declined amniocentesis at this time. She understands that amniocentesis is available at any point after 16 weeks of pregnancy and that she may opt to undergo the procedure at a later date should she change her mind.   Ms. Kindel was interested in pursuing alpha-thalassemia carrier screening for  father of the pregnancy, Molli Hazard. However, she wished to discuss carrier screening with him before placing the order. I encouraged Ms. Turay to contact me once she has had a chance to discuss testing with Molli Hazard. I can facilitate sample collection and the testing process from there.  I counseled Ms. Szymborski regarding the above risks and available options. The approximate face-to-face time with the genetic counselor was 30 minutes.  In summary:  Reviewed low-risk NIPS results  Reduction in risk for Down syndrome,trisomy 18,trisomy 13, sex chromosome aneuploidies, and 22q11.2deletion syndrome  Reviewed quad screening results  1 in 11 (~9%) risk for Down syndrome due to increased hCG and inhibin  Discussed discrepancy in Down syndrome risks for pregnancy and options for follow-up testing  NIPS more accurate than quad screening, so pregnancy likely at low risk for Down syndrome  Increased hCG and inhibin may be associated with other pregnancy complications aside from Down syndrome  Declined amniocentesis  Reviewed carrier screening results and options for follow-up testing  Silent carrier for alpha-thalassemia  Recommend partner carrier screening. Ms. Mamaril will contact me to facilitate this once she has had a chance to discuss testing with the father of the pregnancy  Reviewed results of ultrasound  No fetal anomalies or markers seen  Reduction in risk for fetal aneuploidy  Offered additional testing and screening  Recommend fetal echocardiogram if fetal arrhythmia persists on next ultrasound  Reviewed family history concerns   Gershon Crane, MS Genetic Counselor

## 2019-05-04 ENCOUNTER — Encounter: Payer: Self-pay | Admitting: Advanced Practice Midwife

## 2019-05-04 ENCOUNTER — Telehealth (INDEPENDENT_AMBULATORY_CARE_PROVIDER_SITE_OTHER): Payer: Medicaid Other | Admitting: Advanced Practice Midwife

## 2019-05-04 DIAGNOSIS — O99212 Obesity complicating pregnancy, second trimester: Secondary | ICD-10-CM

## 2019-05-04 DIAGNOSIS — Z3400 Encounter for supervision of normal first pregnancy, unspecified trimester: Secondary | ICD-10-CM

## 2019-05-04 DIAGNOSIS — Z3A23 23 weeks gestation of pregnancy: Secondary | ICD-10-CM | POA: Diagnosis not present

## 2019-05-04 DIAGNOSIS — O99210 Obesity complicating pregnancy, unspecified trimester: Secondary | ICD-10-CM

## 2019-05-04 MED ORDER — GOJJI WEIGHT SCALE MISC: 1.0000 | Freq: Every day | 0 refills | Status: DC | PRN

## 2019-05-04 NOTE — Progress Notes (Signed)
   TELEHEALTH VIRTUAL OBSTETRICS VISIT ENCOUNTER NOTE  I connected with Birgitta D Gallagher on 05/04/19 at  2:30 PM EST by telephone at home and verified that I am speaking with the correct person using two identifiers.   I discussed the limitations, risks, security and privacy concerns of performing an evaluation and management service by telephone and the availability of in person appointments. I also discussed with the patient that there may be a patient responsible charge related to this service. The patient expressed understanding and agreed to proceed.  Subjective:  Michelle Gallagher is a 24 y.o. G1P0000 at [redacted]w[redacted]d being followed for ongoing prenatal care.  She is currently monitored for the following issues for this low-risk pregnancy and has Supervision of normal first pregnancy, antepartum; Obesity affecting pregnancy, antepartum; and Abnormal antenatal AFP screen on their problem list.  Patient reports no complaints. Reports fetal movement. Denies any contractions, bleeding or leaking of fluid.   The following portions of the patient's history were reviewed and updated as appropriate: allergies, current medications, past family history, past medical history, past social history, past surgical history and problem list.   Objective:   General:  Alert, oriented and cooperative.   Mental Status: Normal mood and affect perceived. Normal judgment and thought content.  Rest of physical exam deferred due to type of encounter  Assessment and Plan:  Pregnancy: G1P0000 at [redacted]w[redacted]d 1. Obesity affecting pregnancy, antepartum - Misc. Devices (GOJJI WEIGHT SCALE) MISC; 1 Device by Does not apply route daily as needed. To weight self daily as needed at home. ICD-10 code: O66.90  Dispense: 1 each; Refill: 0  2. Supervision of normal first pregnancy, antepartum - routine care - GTT at next visit   Preterm labor symptoms and general obstetric precautions including but not limited to vaginal bleeding,  contractions, leaking of fluid and fetal movement were reviewed in detail with the patient.  I discussed the assessment and treatment plan with the patient. The patient was provided an opportunity to ask questions and all were answered. The patient agreed with the plan and demonstrated an understanding of the instructions. The patient was advised to call back or seek an in-person office evaluation/go to MAU at [facility] for any urgent or concerning symptoms. Please refer to After Visit Summary for other counseling recommendations.   I provided 12 minutes of non-face-to-face time during this encounter.  Return in about 4 weeks (around 06/01/2019) for in person visit with 28 week labs at GTT .  Future Appointments  Date Time Provider Department Center  05/09/2019  2:30 PM [facility] NURSE WH-MFC MFC-US  05/09/2019  2:30 PM WH-MFC [place] 1 WH-MFCUS MFC-US  05/25/2019  1:10 PM Sharyon Cable, CNM CWH-REN None  06/10/2019  8:10 AM Gerrit Heck, CNM CWH-REN None   Thressa Sheller DNP, CNM  05/04/19  3:24 PM  Center for [employer], [facility]

## 2019-05-09 ENCOUNTER — Other Ambulatory Visit (HOSPITAL_COMMUNITY): Payer: Self-pay | Admitting: *Deleted

## 2019-05-09 ENCOUNTER — Other Ambulatory Visit: Payer: Self-pay

## 2019-05-09 ENCOUNTER — Encounter (HOSPITAL_COMMUNITY): Payer: Self-pay | Admitting: *Deleted

## 2019-05-09 ENCOUNTER — Ambulatory Visit (HOSPITAL_COMMUNITY)
Admission: RE | Admit: 2019-05-09 | Discharge: 2019-05-09 | Disposition: A | Discharge status: Home / Self Care | Payer: Medicaid Other | Source: Ambulatory Visit | Attending: Obstetrics and Gynecology | Admitting: Obstetrics and Gynecology

## 2019-05-09 ENCOUNTER — Ambulatory Visit (HOSPITAL_COMMUNITY): Payer: Medicaid Other | Admitting: *Deleted

## 2019-05-09 DIAGNOSIS — O99210 Obesity complicating pregnancy, unspecified trimester: Secondary | ICD-10-CM | POA: Diagnosis present

## 2019-05-09 DIAGNOSIS — O368390 Maternal care for abnormalities of the fetal heart rate or rhythm, unspecified trimester, not applicable or unspecified: Secondary | ICD-10-CM | POA: Diagnosis not present

## 2019-05-09 DIAGNOSIS — O99212 Obesity complicating pregnancy, second trimester: Secondary | ICD-10-CM | POA: Diagnosis not present

## 2019-05-09 DIAGNOSIS — Z3400 Encounter for supervision of normal first pregnancy, unspecified trimester: Secondary | ICD-10-CM

## 2019-05-09 DIAGNOSIS — Z3A24 24 weeks gestation of pregnancy: Secondary | ICD-10-CM

## 2019-05-09 DIAGNOSIS — O289 Unspecified abnormal findings on antenatal screening of mother: Secondary | ICD-10-CM

## 2019-05-09 DIAGNOSIS — Z362 Encounter for other antenatal screening follow-up: Secondary | ICD-10-CM | POA: Diagnosis not present

## 2019-05-09 DIAGNOSIS — O280 Abnormal hematological finding on antenatal screening of mother: Secondary | ICD-10-CM

## 2019-05-25 ENCOUNTER — Telehealth (INDEPENDENT_AMBULATORY_CARE_PROVIDER_SITE_OTHER): Payer: Medicaid Other | Admitting: Certified Nurse Midwife

## 2019-05-25 ENCOUNTER — Encounter: Payer: Self-pay | Admitting: Certified Nurse Midwife

## 2019-05-25 VITALS — BP 129/71 | HR 106 | Wt 282.4 lb

## 2019-05-25 DIAGNOSIS — Z3A26 26 weeks gestation of pregnancy: Secondary | ICD-10-CM

## 2019-05-25 DIAGNOSIS — O99212 Obesity complicating pregnancy, second trimester: Secondary | ICD-10-CM

## 2019-05-25 DIAGNOSIS — O99210 Obesity complicating pregnancy, unspecified trimester: Secondary | ICD-10-CM

## 2019-05-25 DIAGNOSIS — Z3400 Encounter for supervision of normal first pregnancy, unspecified trimester: Secondary | ICD-10-CM

## 2019-05-25 NOTE — Progress Notes (Signed)
TELEHEALTH OBSTETRICS PRENATAL VIRTUAL VIDEO VISIT ENCOUNTER NOTE  Provider location: [facility] for [employer] at [facility]   I connected with Michelle Gallagher on 05/25/19 at  1:10 PM EST by MyChart Video Encounter at home and verified that I am speaking with the correct person using two identifiers.   I discussed the limitations, risks, security and privacy concerns of performing an evaluation and management service virtually and the availability of in person appointments. I also discussed with the patient that there may be a patient responsible charge related to this service. The patient expressed understanding and agreed to proceed. Subjective:  Michelle Gallagher is a 24 y.o. G1P0000 at [redacted]w[redacted]d being seen today for ongoing prenatal care.  She is currently monitored for the following issues for this low-risk pregnancy and has Supervision of normal first pregnancy, antepartum; Obesity affecting pregnancy, antepartum; and Abnormal antenatal AFP screen on their problem list.  Patient reports no complaints.  Contractions: Not present. Vag. Bleeding: None.  Movement: Present. Denies any leaking of fluid.   The following portions of the patient's history were reviewed and updated as appropriate: allergies, current medications, past family history, past medical history, past social history, past surgical history and problem list.   Objective:   Vitals:   05/25/19 1312  BP: 129/71  Pulse: (!) 106  Weight: 282 lb 6.4 oz (128.1 kg)    Fetal Status:     Movement: Present     General:  Alert, oriented and cooperative. Patient is in no acute distress.  Respiratory: Normal respiratory effort, no problems with respiration noted  Mental Status: Normal mood and affect. Normal behavior. Normal judgment and thought content.  Rest of physical exam deferred due to type of encounter  Imaging: [place] MFM OB FOLLOW UP  Result Date:  05/09/2019 ----------------------------------------------------------------------  OBSTETRICS REPORT                       (Signed Final 05/09/2019 05:22 pm) ---------------------------------------------------------------------- Patient Info  ID #:       [id number]                          D.O.B.:  01-20-1996 (23 yrs)  Name:       Michelle Gallagher              Visit Date: 05/09/2019 02:34 pm ---------------------------------------------------------------------- Performed By  Performed By:     Tommie Raymond BS,       Ref. Address:     [address]                                                             [place], [place]  Attending:        Ma Rings MD         [facility]  Referred By:      Everardo All ---------------------------------------------------------------------- Orders   #  Description                          Code         Ordered By   1  [place] MFM OB FOLLOW UP                  50093.81     Noralee Space  ----------------------------------------------------------------------   #  Order #                    Accession #                 Episode #   1  [id number]                  [id number]                  [id number]  ---------------------------------------------------------------------- Indications   Abnormal biochemical screen (AFP positive)     O28.9   Obesity complicating pregnancy, second         O99.212   trimester (pregravid BMI 45.76)   [redacted] weeks gestation of pregnancy                Z3A.24   Encounter for antenatal screening for          Z36.3   malformations (low risk NIPS, 3.3FF)   Genetic carrier Scientist, research (medical))                   Z14.8  ---------------------------------------------------------------------- Fetal Evaluation  Num Of Fetuses:         1  Fetal Heart Rate(bpm):  151  Cardiac Activity:       Observed  Presentation:           Cephalic  Placenta:                Posterior  P. Cord Insertion:      Previously Visualized  Amniotic Fluid  AFI FV:      Within normal limits                              Largest Pocket(cm)                              5.84 ---------------------------------------------------------------------- Biometry  BPD:      59.8  mm     G. Age:  24w 3d         36  %    CI:        74.56   %    70 - 86                                                          FL/HC:      21.7   %    18.7 - 20.3  HC:      219.8  mm     G. Age:  24w 0d         14  %    HC/AC:      1.09  1.04 - 1.22  AC:      202.4  mm     G. Age:  24w 6d         50  %    FL/BPD:     79.8   %    71 - 87  FL:       47.7  mm     G. Age:  26w 0d         79  %    FL/AC:      23.6   %    20 - 24  CER:      27.5  mm     G. Age:  24w 5d         55  %  LV:        4.9  mm  Est. FW:     776  gm    1 lb 11 oz      67  % ---------------------------------------------------------------------- OB History  Gravidity:    1         Term:   0        Prem:   0        SAB:   0  TOP:          0       Ectopic:  0        Living: 0 ---------------------------------------------------------------------- Gestational Age  LMP:           25w 4d        Date:  11/11/18                 EDD:   08/18/19  U/S Today:     24w 6d                                        EDD:   08/23/19  Best:          24w 4d     Det. By:  Marcella Dubs         EDD:   08/25/19                                      (01/03/19) ---------------------------------------------------------------------- Anatomy  Cranium:               Appears normal         Aortic Arch:            Previously seen  Cavum:                 Appears normal         Ductal Arch:            Previously seen  Ventricles:            Appears normal         Diaphragm:              Appears normal  Choroid Plexus:        Previously seen        Stomach:                Appears normal, left  sided  Cerebellum:             Appears normal         Abdomen:                Previously seen  Posterior Fossa:       Previously seen        Abdominal Wall:         Previously seen  Nuchal Fold:           Not applicable (>20    Cord Vessels:           Appears normal ([redacted]                         wks GA)                                        vessel cord)  Face:                  Orbits and profile     Kidneys:                Appear normal                         previously seen  Lips:                  Previously seen        Bladder:                Appears normal  Thoracic:              Previously seen        Spine:                  Previously seen  Heart:                 Appears normal         Upper Extremities:      Previously seen                         (4CH, axis, and                         situs)  RVOT:                  Previously seen        Lower Extremities:      Previously seen  LVOT:                  Appears normal  Other:  Nasal bone previously seen.  Hands & Feet visualized.  Technically          difficult due to maternal habitus and fetal position. ---------------------------------------------------------------------- Cervix Uterus Adnexa  Cervix  Not visualized (advanced GA >24wks)  Uterus  No abnormality visualized.  Left Ovary  No adnexal mass visualized.  Right Ovary  No adnexal mass visualized.  Cul De Sac  No free fluid seen.  Adnexa  No abnormality visualized. ---------------------------------------------------------------------- Comments  This patient was seen for a follow up growth scan due to  abnormal serum analytes noted on her quad screen.  She  denies any problems since her last exam.  She was informed that  the fetal growth and amniotic fluid  level appears appropriate for her gestational age.  A follow up exam was scheduled in 4 weeks. ----------------------------------------------------------------------                   Johnell Comings, MD Electronically Signed Final Report   05/09/2019 05:22 pm  ----------------------------------------------------------------------   Assessment and Plan:  Pregnancy: G1P0000 at [redacted]w[redacted]d 1. Supervision of normal first pregnancy, antepartum - Patient doing well, no complaints - Routine prenatal care - Anticipatory guidance on upcoming appointments with next being GTT, discussed with patient not to eat or drink after MN the night prior to appointment, patient verbalizes understanding  - Encouraged patient to ask questions about genetic counseling appointment, patient denies questions at this time  - Follow up US for abnormal genetic screening scheduled for 3/4   2. Obesity affecting pregnancy, antepartum - TWG 7lb during pregnancy  - Educated and discussed recommendations during pregnancy    Preterm labor symptoms and general obstetric precautions including but not limited to vaginal bleeding, contractions, leaking of fluid and fetal movement were reviewed in detail with the patient. I discussed the assessment and treatment plan with the patient. The patient was provided an opportunity to ask questions and all were answered. The patient agreed with the plan and demonstrated an understanding of the instructions. The patient was advised to call back or seek an in-person office evaluation/go to MAU at [facility] for any urgent or concerning symptoms. Please refer to After Visit Summary for other counseling recommendations.   I provided 8 minutes of face-to-face time during this encounter.   Future Appointments  Date Time Provider [place]  06/09/2019  2:45 PM [place] NURSE [place] MFC-US  06/09/2019  2:45 PM [place] 5 WH-MFCUS MFC-US  06/10/2019  8:10 AM Gavin Pound, [place], [place] for [employer], [facility]

## 2019-06-09 ENCOUNTER — Ambulatory Visit (HOSPITAL_COMMUNITY): Payer: Medicaid Other | Admitting: *Deleted

## 2019-06-09 ENCOUNTER — Other Ambulatory Visit: Payer: Self-pay

## 2019-06-09 ENCOUNTER — Encounter (HOSPITAL_COMMUNITY): Payer: Self-pay

## 2019-06-09 ENCOUNTER — Ambulatory Visit (HOSPITAL_COMMUNITY)
Admission: RE | Admit: 2019-06-09 | Discharge: 2019-06-09 | Disposition: A | Discharge status: Home / Self Care | Payer: Medicaid Other | Source: Ambulatory Visit | Attending: Obstetrics and Gynecology | Admitting: Obstetrics and Gynecology

## 2019-06-09 DIAGNOSIS — O99213 Obesity complicating pregnancy, third trimester: Secondary | ICD-10-CM | POA: Diagnosis not present

## 2019-06-09 DIAGNOSIS — Z3A29 29 weeks gestation of pregnancy: Secondary | ICD-10-CM

## 2019-06-09 DIAGNOSIS — Z3400 Encounter for supervision of normal first pregnancy, unspecified trimester: Secondary | ICD-10-CM | POA: Diagnosis present

## 2019-06-09 DIAGNOSIS — O99210 Obesity complicating pregnancy, unspecified trimester: Secondary | ICD-10-CM | POA: Diagnosis present

## 2019-06-09 DIAGNOSIS — Z362 Encounter for other antenatal screening follow-up: Secondary | ICD-10-CM | POA: Diagnosis not present

## 2019-06-09 DIAGNOSIS — O280 Abnormal hematological finding on antenatal screening of mother: Secondary | ICD-10-CM | POA: Diagnosis present

## 2019-06-10 ENCOUNTER — Encounter: Payer: Self-pay | Admitting: General Practice

## 2019-06-10 ENCOUNTER — Ambulatory Visit (INDEPENDENT_AMBULATORY_CARE_PROVIDER_SITE_OTHER): Payer: Medicaid Other

## 2019-06-10 ENCOUNTER — Other Ambulatory Visit (HOSPITAL_COMMUNITY): Payer: Self-pay | Admitting: *Deleted

## 2019-06-10 VITALS — BP 101/68 | HR 72 | Wt 290.0 lb

## 2019-06-10 DIAGNOSIS — Z23 Encounter for immunization: Secondary | ICD-10-CM

## 2019-06-10 DIAGNOSIS — O285 Abnormal chromosomal and genetic finding on antenatal screening of mother: Secondary | ICD-10-CM

## 2019-06-10 DIAGNOSIS — Z3402 Encounter for supervision of normal first pregnancy, second trimester: Secondary | ICD-10-CM

## 2019-06-10 DIAGNOSIS — O99210 Obesity complicating pregnancy, unspecified trimester: Secondary | ICD-10-CM

## 2019-06-10 NOTE — Progress Notes (Signed)
Tdap today

## 2019-06-10 NOTE — Progress Notes (Signed)
   PRENATAL VISIT NOTE  Subjective:  Michelle Gallagher is a 24 y.o. G1P0000 at [redacted]w[redacted]d who presents today for routine prenatal care.  She is currently being monitored for supervision of a low-risk pregnancy with problems as listed below.  Patient has no pregnancy related concerns and endorses fetal movement.  She denies vaginal concerns including discharge, bleeding, leaking, itching, and burning. She endorses some abdominal cramping and pressure, but denies contractions.  She states the cramping and pressure intermittent and patient rates it 3/10 when it occurs. Patient reports a "rash" on her back that is itchy.   Patient Active Problem List   Diagnosis Date Noted  . Abnormal antenatal AFP screen 03/15/2019  . Obesity affecting pregnancy, antepartum 02/04/2019  . Supervision of normal first pregnancy, antepartum 01/24/2019    The following portions of the patient's history were reviewed and updated as appropriate: allergies, current medications, past family history, past medical history, past social history, past surgical history and problem list. Problem list updated.  Objective:   Vitals:   06/10/19 0812  BP: 101/68  Pulse: 72  Weight: 290 lb (131.5 kg)    Fetal Status: Fetal Heart Rate (bpm): 145 Fundal Height: 31 cm Movement: Present     General:  Alert, oriented and cooperative. Patient is in no acute distress.  Skin: Skin is warm and dry. Area on right middle back with some eczema like patches.  Cardiovascular: Regular rate and rhythm.  Respiratory: Normal respiratory effort. CTA-Bilaterally  Abdomen: Soft, gravid, appropriate for gestational age.  Pelvic: Cervical exam deferred        Extremities: Normal range of motion.  Edema: None  Mental Status: Normal mood and affect. Normal behavior. Normal judgment and thought content.   Assessment and Plan:  Pregnancy: G1P0000 at [redacted]w[redacted]d  1. Encounter for supervision of normal first pregnancy in second trimester  -Reviewed blood  draw procedures and labs which also include check of iron level.  -Discussed how results of GTT are handled including diabetic education and BS testing for abnormal results and routine care for normal results.  -Discussed usage of lotion for back.  Instructed to monitor area and report any worsening of symptoms.  -Anticipatory guidance for upcoming appts. - Glucose Tolerance, 2 Hours w/1 Hour - RPR - CBC - HIV Antibody (routine testing w rflx)  2. Obesity affecting pregnancy, antepartum -TWG 15lbs to date -Discussed expected weight gain of 11-25lbs.   Preterm labor symptoms and general obstetric precautions including but not limited to vaginal bleeding, contractions, leaking of fluid and fetal movement were reviewed with the patient.  Please refer to After Visit Summary for other counseling recommendations.  Return in about 3 weeks (around 07/01/2019) for LR-ROB VV.  Future Appointments  Date Time Provider Department Center  07/01/2019 10:30 AM Gerrit Heck, CNM C[facility]-REN None  07/14/2019  2:45 PM WH-MFC [place] 2 WH-MFCUS MFC-US  07/28/2019 10:30 AM Raelyn Mora, CNM C[facility]-REN None    Cherre Robins, CNM 06/10/2019, 8:44 AM

## 2019-06-11 LAB — CBC
Hematocrit: 32.8 % — ABNORMAL LOW (ref 34.0–46.6)
Hemoglobin: 10.5 g/dL — ABNORMAL LOW (ref 11.1–15.9)
MCH: 23.7 pg — ABNORMAL LOW (ref 26.6–33.0)
MCHC: 32 g/dL (ref 31.5–35.7)
MCV: 74 fL — ABNORMAL LOW (ref 79–97)
Platelets: 218 10*3/uL (ref 150–450)
RBC: 4.43 x10E6/uL (ref 3.77–5.28)
RDW: 16.5 % — ABNORMAL HIGH (ref 11.7–15.4)
WBC: 11 10*3/uL — ABNORMAL HIGH (ref 3.4–10.8)

## 2019-06-11 LAB — GLUCOSE TOLERANCE, 2 HOURS W/ 1HR
Glucose, 1 hour: 132 mg/dL (ref 65–179)
Glucose, 2 hour: 99 mg/dL (ref 65–152)
Glucose, Fasting: 83 mg/dL (ref 65–91)

## 2019-06-11 LAB — RPR: RPR Ser Ql: NONREACTIVE

## 2019-06-11 LAB — HIV ANTIBODY (ROUTINE TESTING W REFLEX): HIV Screen 4th Generation wRfx: NONREACTIVE

## 2019-06-15 ENCOUNTER — Other Ambulatory Visit (HOSPITAL_COMMUNITY): Payer: Self-pay

## 2019-06-15 MED ORDER — FERROUS SULFATE 325 (65 FE) MG PO TBEC: 325.0000 mg | DELAYED_RELEASE_TABLET | Freq: Every day | ORAL | 3 refills | Status: DC

## 2019-06-20 ENCOUNTER — Encounter: Payer: Self-pay | Admitting: General Practice

## 2019-07-01 ENCOUNTER — Telehealth (INDEPENDENT_AMBULATORY_CARE_PROVIDER_SITE_OTHER): Payer: Medicaid Other

## 2019-07-01 VITALS — BP 131/81 | HR 99 | Wt 290.0 lb

## 2019-07-01 DIAGNOSIS — Z3A32 32 weeks gestation of pregnancy: Secondary | ICD-10-CM

## 2019-07-01 DIAGNOSIS — O99210 Obesity complicating pregnancy, unspecified trimester: Secondary | ICD-10-CM

## 2019-07-01 DIAGNOSIS — Z3400 Encounter for supervision of normal first pregnancy, unspecified trimester: Secondary | ICD-10-CM

## 2019-07-01 NOTE — Progress Notes (Signed)
I connected with@ on 07/01/19 at 10:30 AM EDT by: mychart video and verified that I am speaking with the correct person using two identifiers.  Patient is located at home and provider is located at [facility].     The purpose of this virtual visit is to provide medical care while limiting exposure to the novel coronavirus. I discussed the limitations, risks, security and privacy concerns of performing an evaluation and management service by mychart video and the availability of in person appointments. I also discussed with the patient that there may be a patient responsible charge related to this service. By engaging in this virtual visit, you consent to the provision of healthcare.  Additionally, you authorize for your insurance to be billed for the services provided during this visit.  The patient expressed understanding and agreed to proceed.  The following staff members participated in the virtual visit:  J.Nissim Fleischer, CNM    PRENATAL VISIT NOTE  Subjective:  Michelle Gallagher is a 24 y.o. G1P0000 at [redacted]w[redacted]d  for phone visit for ongoing prenatal care.  She is currently monitored for the following issues for this low-risk pregnancy and has Supervision of normal first pregnancy, antepartum; Obesity affecting pregnancy, antepartum; and Abnormal antenatal AFP screen on their problem list.  Patient reports no complaints.  Contractions: Not present. Vag. Bleeding: None.  Movement: Present. Denies leaking of fluid.   The following portions of the patient's history were reviewed and updated as appropriate: allergies, current medications, past family history, past medical history, past social history, past surgical history and problem list.   Objective:   Vitals:   07/01/19 0929  BP: 131/81  Pulse: 99  Weight: 290 lb (131.5 kg)   Self-Obtained  Fetal Status:     Movement: Present     Assessment and Plan:  Pregnancy: G1P0000 at [redacted]w[redacted]d 1. Supervision of normal first pregnancy,  antepartum -Anticipatory guidance for upcoming appts. -Reviewed GBS  -Educated on GBS bacteria including what it is, why we test, and how and when we treat if needed. -Discussed and reviewed postpartum planning including contraception, pediatricians, and infant feedings.  2. Obesity affecting pregnancy, antepartum -Weight today 290lbs -TWG 11lbs -Discussed staying on lower threshold of weight gain. -Reports not taking baby aspirin.   Preterm labor symptoms and general obstetric precautions including but not limited to vaginal bleeding, contractions, leaking of fluid and fetal movement were reviewed in detail with the patient.  No follow-ups on file.  Future Appointments  Date Time Provider Department Center  07/01/2019 10:30 AM Gerrit Heck, CNM C[facility]-REN None  07/14/2019  2:45 PM WH-MFC NURSE WH-MFC MFC-US  07/14/2019  2:45 PM WH-MFC [place] 2 WH-MFCUS MFC-US  07/28/2019 10:30 AM Raelyn Mora, CNM CWH-REN None     Time spent on virtual visit: 7 minutes  Cherre Robins, CNM

## 2019-07-14 ENCOUNTER — Other Ambulatory Visit: Payer: Self-pay

## 2019-07-14 ENCOUNTER — Ambulatory Visit (HOSPITAL_COMMUNITY): Payer: Medicaid Other | Admitting: *Deleted

## 2019-07-14 ENCOUNTER — Encounter (HOSPITAL_COMMUNITY): Payer: Self-pay

## 2019-07-14 ENCOUNTER — Ambulatory Visit (HOSPITAL_COMMUNITY)
Admission: RE | Admit: 2019-07-14 | Discharge: 2019-07-14 | Disposition: A | Discharge status: Home / Self Care | Payer: Medicaid Other | Source: Ambulatory Visit | Attending: Obstetrics and Gynecology | Admitting: Obstetrics and Gynecology

## 2019-07-14 DIAGNOSIS — O403XX0 Polyhydramnios, third trimester, not applicable or unspecified: Secondary | ICD-10-CM | POA: Diagnosis not present

## 2019-07-14 DIAGNOSIS — Z362 Encounter for other antenatal screening follow-up: Secondary | ICD-10-CM | POA: Diagnosis not present

## 2019-07-14 DIAGNOSIS — O289 Unspecified abnormal findings on antenatal screening of mother: Secondary | ICD-10-CM

## 2019-07-14 DIAGNOSIS — O99210 Obesity complicating pregnancy, unspecified trimester: Secondary | ICD-10-CM | POA: Diagnosis present

## 2019-07-14 DIAGNOSIS — O99213 Obesity complicating pregnancy, third trimester: Secondary | ICD-10-CM | POA: Diagnosis not present

## 2019-07-14 DIAGNOSIS — O285 Abnormal chromosomal and genetic finding on antenatal screening of mother: Secondary | ICD-10-CM | POA: Diagnosis present

## 2019-07-14 DIAGNOSIS — Z3A34 34 weeks gestation of pregnancy: Secondary | ICD-10-CM

## 2019-07-14 DIAGNOSIS — Z3400 Encounter for supervision of normal first pregnancy, unspecified trimester: Secondary | ICD-10-CM | POA: Diagnosis present

## 2019-07-14 DIAGNOSIS — Z148 Genetic carrier of other disease: Secondary | ICD-10-CM

## 2019-07-15 ENCOUNTER — Other Ambulatory Visit (HOSPITAL_COMMUNITY): Payer: Self-pay | Admitting: *Deleted

## 2019-07-15 DIAGNOSIS — O403XX0 Polyhydramnios, third trimester, not applicable or unspecified: Secondary | ICD-10-CM

## 2019-07-28 ENCOUNTER — Ambulatory Visit (INDEPENDENT_AMBULATORY_CARE_PROVIDER_SITE_OTHER): Payer: Medicaid Other | Admitting: Obstetrics and Gynecology

## 2019-07-28 ENCOUNTER — Other Ambulatory Visit (HOSPITAL_COMMUNITY)
Admission: RE | Admit: 2019-07-28 | Discharge: 2019-07-28 | Disposition: A | Discharge status: Home / Self Care | Payer: Medicaid Other | Source: Ambulatory Visit | Attending: Obstetrics and Gynecology | Admitting: Obstetrics and Gynecology

## 2019-07-28 ENCOUNTER — Other Ambulatory Visit: Payer: Self-pay

## 2019-07-28 VITALS — BP 119/77 | HR 98 | Temp 97.8°F | Wt 294.2 lb

## 2019-07-28 DIAGNOSIS — Z3400 Encounter for supervision of normal first pregnancy, unspecified trimester: Secondary | ICD-10-CM | POA: Insufficient documentation

## 2019-07-28 DIAGNOSIS — Z3A36 36 weeks gestation of pregnancy: Secondary | ICD-10-CM

## 2019-07-28 DIAGNOSIS — O99213 Obesity complicating pregnancy, third trimester: Secondary | ICD-10-CM

## 2019-07-28 DIAGNOSIS — O99210 Obesity complicating pregnancy, unspecified trimester: Secondary | ICD-10-CM

## 2019-07-29 LAB — CERVICOVAGINAL ANCILLARY ONLY
Bacterial Vaginitis (gardnerella): NEGATIVE
Candida Glabrata: NEGATIVE
Candida Vaginitis: POSITIVE — AB
Chlamydia: NEGATIVE
Comment: NEGATIVE
Comment: NEGATIVE
Comment: NEGATIVE
Comment: NEGATIVE
Comment: NEGATIVE
Comment: NORMAL
Neisseria Gonorrhea: NEGATIVE
Trichomonas: NEGATIVE

## 2019-07-30 ENCOUNTER — Encounter: Payer: Self-pay | Admitting: Obstetrics and Gynecology

## 2019-07-30 NOTE — Progress Notes (Signed)
   LOW-RISK PREGNANCY OFFICE VISIT Patient name: Michelle Gallagher MRN [id number]  Date of birth: 10-15-1995 Chief Complaint:   Routine Prenatal Visit  History of Present Illness:   Michelle Gallagher is a 24 y.o. G69P0000 female at [redacted]w[redacted]d with an Estimated Date of Delivery: 08/25/19 being seen today for ongoing management of a low-risk pregnancy.  Today she reports pelvic pressure/pain. Contractions: Not present. Vag. Bleeding: None.  Movement: Present. denies leaking of fluid. Review of Systems:   Pertinent items are noted in HPI Denies abnormal vaginal discharge w/ itching/odor/irritation, headaches, visual changes, shortness of breath, chest pain, abdominal pain, severe nausea/vomiting, or problems with urination or bowel movements unless otherwise stated above. Pertinent History Reviewed:  Reviewed past medical,surgical, social, obstetrical and family history.  Reviewed problem list, medications and allergies. Physical Assessment:   Vitals:   07/28/19 1019  BP: 119/77  Pulse: 98  Temp: 97.8 F (36.6 C)  Weight: 294 lb 3.2 oz (133.4 kg)  Body mass index is 48.96 kg/m.        Physical Examination:   General appearance: Well appearing, and in no distress  Mental status: Alert, oriented to person, place, and time  Skin: Warm & dry  Cardiovascular: Normal heart rate noted  Respiratory: Normal respiratory effort, no distress  Abdomen: Soft, gravid, nontender  Pelvic: Cervical exam performed  Dilation: Closed Effacement (%): Thick Station: Ballotable  Extremities: Edema: None  Fetal Status: Fetal Heart Rate (bpm): 150 Fundal Height: 43 cm Movement: Present Presentation: Undeterminable  No results found for this or any previous visit (from the past 24 hour(s)).  Assessment & Plan:  1) Low-risk pregnancy G1P0000 at [redacted]w[redacted]d with an Estimated Date of Delivery: 08/25/19   2) Supervision of normal first pregnancy, antepartum  - Cervicovaginal ancillary only( [place]),  - Culture,  beta strep (group b only)  3) Obesity affecting pregnancy, antepartum - F/U U/S scheduled for 08/12/2019   Meds: No orders of the defined types were placed in this encounter.  Labs/procedures today: GBS, GC/CT & cervical exam  Plan:  Continue routine obstetrical care   Reviewed: Preterm labor symptoms and general obstetric precautions including but not limited to vaginal bleeding, contractions, leaking of fluid and fetal movement were reviewed in detail with the patient.  All questions were answered. Has home bp cuff. Check bp weekly, let us know if >140/90.   Follow-up: Return in about 2 weeks (around 08/11/2019) for Return OB - My Chart video.  Orders Placed This Encounter  Procedures  . Culture, beta strep (group b only)   Raelyn Mora MSN, CNM 07/28/2019

## 2019-08-01 LAB — CULTURE, BETA STREP (GROUP B ONLY): Strep Gp B Culture: NEGATIVE

## 2019-08-03 ENCOUNTER — Other Ambulatory Visit: Payer: Self-pay

## 2019-08-03 ENCOUNTER — Ambulatory Visit (INDEPENDENT_AMBULATORY_CARE_PROVIDER_SITE_OTHER): Payer: Medicaid Other | Admitting: Women's Health

## 2019-08-03 VITALS — BP 118/77 | HR 111 | Temp 98.3°F | Wt 290.2 lb

## 2019-08-03 DIAGNOSIS — O409XX0 Polyhydramnios, unspecified trimester, not applicable or unspecified: Secondary | ICD-10-CM

## 2019-08-03 DIAGNOSIS — O403XX0 Polyhydramnios, third trimester, not applicable or unspecified: Secondary | ICD-10-CM

## 2019-08-03 DIAGNOSIS — Z3A36 36 weeks gestation of pregnancy: Secondary | ICD-10-CM

## 2019-08-03 DIAGNOSIS — Z3400 Encounter for supervision of normal first pregnancy, unspecified trimester: Secondary | ICD-10-CM

## 2019-08-03 DIAGNOSIS — O99213 Obesity complicating pregnancy, third trimester: Secondary | ICD-10-CM

## 2019-08-03 NOTE — Progress Notes (Signed)
Subjective:  Michelle Gallagher is a 24 y.o. G1P0000 at [redacted]w[redacted]d being seen today for ongoing prenatal care.  She is currently monitored for the following issues for this low-risk pregnancy and has Supervision of normal first pregnancy, antepartum; Obesity affecting pregnancy, antepartum; Abnormal antenatal AFP screen; and Polyhydramnios affecting pregnancy on their problem list.  Patient reports pelvic pressure..  Contractions: Not present. Vag. Bleeding: None.  Movement: Present. Denies leaking of fluid.   The following portions of the patient's history were reviewed and updated as appropriate: allergies, current medications, past family history, past medical history, past social history, past surgical history and problem list. Problem list updated.  Objective:   Vitals:   08/03/19 1056  BP: 118/77  Pulse: (!) 111  Temp: 98.3 F (36.8 C)  Weight: 290 lb 3.2 oz (131.6 kg)    Fetal Status: Fetal Heart Rate (bpm): 144 Fundal Height: 44 cm Movement: Present     General:  Alert, oriented and cooperative. Patient is in no acute distress.  Skin: Skin is warm and dry. No rash noted.   Cardiovascular: Normal heart rate noted  Respiratory: Normal respiratory effort, no problems with respiration noted  Abdomen: Soft, gravid, appropriate for gestational age. Pain/Pressure: Present     Pelvic: Vag. Bleeding: None     Cervical exam deferred        Extremities: Normal range of motion.  Edema: None  Mental Status: Normal mood and affect. Normal behavior. Normal judgment and thought content.   Urinalysis:      Assessment and Plan:  Pregnancy: G1P0000 at [redacted]w[redacted]d  1. Supervision of normal first pregnancy, antepartum -peds list given -MFM [place] scheduled 08/12/2019, pt aware  2. Polyhydramnios affecting pregnancy -28cm on 07/14/2019 -FH today 44cm   Term labor symptoms and general obstetric precautions including but not limited to vaginal bleeding, contractions, leaking of fluid and fetal movement  were reviewed in detail with the patient. Please refer to After Visit Summary for other counseling recommendations.  Return in about 1 week (around 08/10/2019) for virtual ROB.   Shadie Sweatman, Odie Sera, NP

## 2019-08-03 NOTE — Patient Instructions (Addendum)
Signs and Symptoms of Labor Labor is your body's natural process of moving your baby, placenta, and umbilical cord out of your uterus. The process of labor usually starts when your baby is full-term, between 37 and 40 weeks of pregnancy. How will I know when I am close to going into labor? As your body prepares for labor and the birth of your baby, you may notice the following symptoms in the weeks and days before true labor starts:  Having a strong desire to get your home ready to receive your new baby. This is called nesting. Nesting may be a sign that labor is approaching, and it may occur several weeks before birth. Nesting may involve cleaning and organizing your home.  Passing a small amount of thick, bloody mucus out of your vagina (normal bloody show or losing your mucus plug). This may happen more than a week before labor begins, or it might occur right before labor begins as the opening of the cervix starts to widen (dilate). For some women, the entire mucus plug passes at once. For others, smaller portions of the mucus plug may gradually pass over several days.  Your baby moving (dropping) lower in your pelvis to get into position for birth (lightening). When this happens, you may feel more pressure on your bladder and pelvic bone and less pressure on your ribs. This may make it easier to breathe. It may also cause you to need to urinate more often and have problems with bowel movements.  Having "practice contractions" (Braxton Hicks contractions) that occur at irregular (unevenly spaced) intervals that are more than 10 minutes apart. This is also called false labor. False labor contractions are common after exercise or sexual activity, and they will stop if you change position, rest, or drink fluids. These contractions are usually mild and do not get stronger over time. They may feel like: ? A backache or back pain. ? Mild cramps, similar to menstrual cramps. ? Tightening or pressure in  your abdomen. Other early symptoms that labor may be starting soon include:  Nausea or loss of appetite.  Diarrhea.  Having a sudden burst of energy, or feeling very tired.  Mood changes.  Having trouble sleeping. How will I know when labor has begun? Signs that true labor has begun may include:  Having contractions that come at regular (evenly spaced) intervals and increase in intensity. This may feel like more intense tightening or pressure in your abdomen that moves to your back. ? Contractions may also feel like rhythmic pain in your upper thighs or back that comes and goes at regular intervals. ? For first-time mothers, this change in intensity of contractions often occurs at a more gradual pace. ? Women who have given birth before may notice a more rapid progression of contraction changes.  Having a feeling of pressure in the vaginal area.  Your water breaking (rupture of membranes). This is when the sac of fluid that surrounds your baby breaks. When this happens, you will notice fluid leaking from your vagina. This may be clear or blood-tinged. Labor usually starts within 24 hours of your water breaking, but it may take longer to begin. ? Some women notice this as a gush of fluid. ? Others notice that their underwear repeatedly becomes damp. Follow these instructions at home:   When labor starts, or if your water breaks, call your health care provider or nurse care line. Based on your situation, they will determine when you should go in for an   exam.  When you are in early labor, you may be able to rest and manage symptoms at home. Some strategies to try at home include: ? Breathing and relaxation techniques. ? Taking a warm bath or shower. ? Listening to music. ? Using a heating pad on the lower back for pain. If you are directed to use heat:  Place a towel between your skin and the heat source.  Leave the heat on for 20-30 minutes.  Remove the heat if your skin turns  bright red. This is especially important if you are unable to feel pain, heat, or cold. You may have a greater risk of getting burned. Get help right away if:  You have painful, regular contractions that are 5 minutes apart or less.  Labor starts before you are [redacted] weeks along in your pregnancy.  You have a fever.  You have a headache that does not go away.  You have bright red blood coming from your vagina.  You do not feel your baby moving.  You have a sudden onset of: ? Severe headache with vision problems. ? Nausea, vomiting, or diarrhea. ? Chest pain or shortness of breath. These symptoms may be an emergency. If your health care provider recommends that you go to the hospital or birth center where you plan to deliver, do not drive yourself. Have someone else drive you, or call emergency services (911 in the [place] Summary  Labor is your body's natural process of moving your baby, placenta, and umbilical cord out of your uterus.  The process of labor usually starts when your baby is full-term, between 29 and 40 weeks of pregnancy.  When labor starts, or if your water breaks, call your health care provider or nurse care line. Based on your situation, they will determine when you should go in for an exam. This information is not intended to replace advice given to you by your health care provider. Make sure you discuss any questions you have with your health care provider. Document Revised: 12/22/2016 Document Reviewed: 08/29/2016 Elsevier Patient Education  2020 [place] Assessment Unit (MAU)  The Maternity Assessment Unit (MAU) is located at the [facility] and [place] [facility]. The address is: [address] [place], [place], [place] [address]. Please see map below for additional directions.    The Maternity Assessment Unit is designed to help you during your pregnancy, and for up to 6 weeks after delivery, with any  pregnancy- or postpartum-related emergencies, if you think you are in labor, or if your water has broken. For example, if you experience nausea and vomiting, vaginal bleeding, severe abdominal or pelvic pain, elevated blood pressure or other problems related to your pregnancy or postpartum time, please come to the Maternity Assessment Unit for assistance.      AREA PEDIATRIC/FAMILY PRACTICE PHYSICIANS  [facility] [address] [place], [place] [address] Phone - [phone]   Fax - [place] [address] [place], [place]  [address] Phone - [phone]   Fax - [phone]  [place] [address], [place] [address], [place]  [address] Phone - [phone]   Fax - [phone]  [facility] [address] [place], [place]  [address] Phone - [phone]   Fax - [phone]  [place] [address] [place], [place]  [address] Phone - [phone]   Fax - [phone]  [facility] [address] [place], [place]  [address] Phone - [phone]   Fax - [phone]  [facility] [place] [address] [place], [place]  [address] Phone - [phone]   Fax - [phone]  [facility] AT [facility] [address] [place], [address] [place], [place]  [address] Phone - [phone]   Fax - [phone]  [facility] AT [facility] [place] [address] [place], [place]  [address] Phone - [phone]   Fax - [phone] [facility] AT [place] [address] [place], [place]  [address] Phone - [phone]   Fax - [phone]  [facility] [address] [place], [place]  [address] Phone - [phone]   Fax - [phone]  [facility] [address], [address] [place], [place]  [address] Phone - [phone]   Fax - [phone]  [facility] [place] [address] [place], [place]  [address] Phone - [phone]   Fax - [phone]  [facility] [address], [address] [place], [place]  [address] Phone - [phone]  [facility] [address] [place], [place]  [address] Phone - [phone]   Fax - [phone]  [facility] [address] [place], [place]  [address] Phone - [phone]   Fax - [phone]  [facility] [address] [place], [place]  [address] Phone - [phone]   Fax - [phone]  [facility] [address] [place], [place]  [address] Phone - [phone]   Fax - [phone]   [facility] [address] [place] [address] [place], [place]  [address] Phone - [phone]   Fax - [phone]  [facility] [address] [place], [place]  [address] Phone - [phone]   Fax - [phone]  [facility] [address] [place], [place]  [address] Phone - [phone]   Fax - [phone]  [facility] [address] [place], [place]  [address] Phone - [phone]   Fax - [phone]  [place] [facility] [address] [place], [place]  [address] Phone - [phone]   Fax - [phone] Gerarda Fraction [address] [place], [place]  [address] Phone - [phone]   Fax - [phone]  [facility] [address] [place], [place]  [address] Phone - [phone]   Fax - [phone]  [facility] - [place] [address] [place], [place]  [address] Phone - [phone]   Fax - [phone]

## 2019-08-06 ENCOUNTER — Encounter (HOSPITAL_COMMUNITY): Payer: Self-pay | Admitting: Obstetrics and Gynecology

## 2019-08-06 ENCOUNTER — Inpatient Hospital Stay (HOSPITAL_COMMUNITY)
Admission: AD | Admit: 2019-08-06 | Discharge: 2019-08-07 | Disposition: A | Discharge status: Home / Self Care | Payer: Medicaid Other | Attending: Obstetrics and Gynecology | Admitting: Obstetrics and Gynecology

## 2019-08-06 ENCOUNTER — Other Ambulatory Visit: Payer: Self-pay

## 2019-08-06 DIAGNOSIS — O26893 Other specified pregnancy related conditions, third trimester: Secondary | ICD-10-CM | POA: Diagnosis not present

## 2019-08-06 DIAGNOSIS — R439 Unspecified disturbances of smell and taste: Secondary | ICD-10-CM | POA: Diagnosis not present

## 2019-08-06 DIAGNOSIS — R05 Cough: Secondary | ICD-10-CM | POA: Diagnosis present

## 2019-08-06 DIAGNOSIS — Z20822 Contact with and (suspected) exposure to covid-19: Secondary | ICD-10-CM | POA: Insufficient documentation

## 2019-08-06 DIAGNOSIS — Z3A37 37 weeks gestation of pregnancy: Secondary | ICD-10-CM | POA: Insufficient documentation

## 2019-08-06 DIAGNOSIS — F17210 Nicotine dependence, cigarettes, uncomplicated: Secondary | ICD-10-CM | POA: Diagnosis not present

## 2019-08-06 DIAGNOSIS — J069 Acute upper respiratory infection, unspecified: Secondary | ICD-10-CM

## 2019-08-06 DIAGNOSIS — U071 COVID-19: Secondary | ICD-10-CM

## 2019-08-06 DIAGNOSIS — R6883 Chills (without fever): Secondary | ICD-10-CM | POA: Diagnosis not present

## 2019-08-06 DIAGNOSIS — R52 Pain, unspecified: Secondary | ICD-10-CM | POA: Diagnosis not present

## 2019-08-06 DIAGNOSIS — O99333 Smoking (tobacco) complicating pregnancy, third trimester: Secondary | ICD-10-CM | POA: Diagnosis not present

## 2019-08-06 HISTORY — DX: COVID-19: U07.1

## 2019-08-06 LAB — URINALYSIS, ROUTINE W REFLEX MICROSCOPIC
Bilirubin Urine: NEGATIVE
Glucose, UA: NEGATIVE mg/dL
Hgb urine dipstick: NEGATIVE
Ketones, ur: NEGATIVE mg/dL
Leukocytes,Ua: NEGATIVE
Nitrite: NEGATIVE
Protein, ur: 100 mg/dL — AB
Specific Gravity, Urine: 1.024 (ref 1.005–1.030)
pH: 6 (ref 5.0–8.0)

## 2019-08-06 MED ORDER — ACETAMINOPHEN 325 MG PO TABS
650.0000 mg | ORAL_TABLET | Freq: Once | ORAL | Status: AC
  Administered 2019-08-06: 650 mg via ORAL
  Filled 2019-08-06: qty 2

## 2019-08-06 MED ORDER — LIDOCAINE HCL (PF) 1 % IJ SOLN
INTRAMUSCULAR | Status: AC
  Filled 2019-08-06: qty 5

## 2019-08-06 MED ORDER — LACTATED RINGERS IV BOLUS
1000.0000 mL | Freq: Once | INTRAVENOUS | Status: AC
  Administered 2019-08-06: 22:00:00 1000 mL via INTRAVENOUS

## 2019-08-06 NOTE — MAU Provider Note (Signed)
History     CSN: [id number]  Arrival date and time: 08/06/19 2040   First Provider Initiated Contact with Patient 08/06/19 2155      Chief Complaint  Patient presents with  . Cough   HPI  Michelle Gallagher is a 24 yo G1P0 at 37.[redacted] weeks EGA who is presenting to MAU complaining of chills, body aches, coughing, and loss of smell. She lives in a group home with 6 other pregnant females, and the only other people she interacts with are her father and sister when she goes to her father's house.  She noticed her loss of smell this morning. Her cough and SOB started yesterday. She has had a bit of mucous production, but not much. Denies hemoptysis or fevers, but does report chills.   She is feeling her baby move, denies vaginal bleeding, leakage of fluid or contractions. Reports some pelvic pressure when she coughs.  Also reports that she has not been drinking much over the last day or so. She had gatorade about 4.5 hours ago, but reports decreased fluid intake.  OB History    Gravida  1   Para  0   Term  0   Preterm  0   AB  0   Living  0     SAB  0   TAB  0   Ectopic  0   Multiple  0   Live Births  0           Past Medical History:  Diagnosis Date  . Asthma     Past Surgical History:  Procedure Laterality Date  . NO PAST SURGERIES      Family History  Problem Relation Age of Onset  . Multiple sclerosis Mother     Social History   Tobacco Use  . Smoking status: Light Tobacco Smoker    Packs/day: 0.10    Types: Cigarettes    Last attempt to quit: 12/07/2018    Years since quitting: 0.6  . Smokeless tobacco: Never Used  Substance Use Topics  . Alcohol use: Not Currently  . Drug use: No    Allergies:  Allergies  Allergen Reactions  . Pollen Extract     Medications Prior to Admission  Medication Sig Dispense Refill Last Dose  . acetaminophen (TYLENOL) 500 MG tablet Take 500 mg by mouth every 6 (six) hours as needed.   08/05/2019 at Unknown time  .  Blood Pressure Monitoring (BLOOD PRESSURE MONITOR AUTOMAT) DEVI 1 Device by Does not apply route daily. 1 kit 0 Unknown at Unknown time  . ferrous sulfate 325 (65 FE) MG EC tablet Take 1 tablet (325 mg total) by mouth daily with breakfast. (Patient not taking: Reported on 07/01/2019) 45 tablet 3   . Misc. Devices (GOJJI WEIGHT SCALE) MISC 1 Device by Does not apply route daily as needed. To weight self daily as needed at home. ICD-10 code: O09.90 1 each 0 More than a month at Unknown time  . Prenatal Vit-Fe Fumarate-FA (PREPLUS) 27-1 MG TABS Take 1 tablet by mouth daily. 60 tablet 6 More than a month at Unknown time    Review of Systems  All other systems reviewed and are negative.  Physical Exam   Blood pressure 123/69, pulse 99, temperature 99.4 F (37.4 C), temperature source Oral, resp. rate (!) 28, last menstrual period 11/11/2018.  Physical Exam  Nursing note and vitals reviewed. Constitutional: She is oriented to person, place, and time. She appears well-developed and well-nourished.  Ill-appearing and  tired-looking  HENT:  Head: Normocephalic and atraumatic.  Eyes: Pupils are equal, round, and reactive to light. Conjunctivae and EOM are normal.  Cardiovascular: Normal rate, regular rhythm, normal heart sounds and intact distal pulses.  Respiratory: Effort normal and breath sounds normal. No respiratory distress.  No conversational dyspnea  GI: Soft. Bowel sounds are normal. She exhibits no distension and no mass. There is no abdominal tenderness. There is no rebound and no guarding.  Musculoskeletal:        General: Normal range of motion.     Cervical back: Normal range of motion and neck supple.  Neurological: She is alert and oriented to person, place, and time. She has normal reflexes.  Skin: Skin is warm and dry.  Psychiatric: She has a normal mood and affect. Her behavior is normal. Judgment and thought content normal.    MAU Course  Procedures  MDM -IV  fluids -Tylenol for body aches -COVID 19 swab  NST -baseline: 160 -variability: moderate -accels: 15x15 -decels: absent -interpretation: reactive, fetal tachycardia  Results for orders placed or performed during the hospital encounter of 08/06/19 (from the past 24 hour(s))  Urinalysis, Routine w reflex microscopic     Status: Abnormal   Collection Time: 08/06/19  9:47 PM  Result Value Ref Range   Color, Urine YELLOW YELLOW   APPearance CLOUDY (A) CLEAR   Specific Gravity, Urine 1.024 1.005 - 1.030   pH 6.0 5.0 - 8.0   Glucose, UA NEGATIVE NEGATIVE mg/dL   Hgb urine dipstick NEGATIVE NEGATIVE   Bilirubin Urine NEGATIVE NEGATIVE   Ketones, ur NEGATIVE NEGATIVE mg/dL   Protein, ur 100 (A) NEGATIVE mg/dL   Nitrite NEGATIVE NEGATIVE   Leukocytes,Ua NEGATIVE NEGATIVE   RBC / HPF 6-10 0 - 5 RBC/hpf   WBC, UA 6-10 0 - 5 WBC/hpf   Bacteria, UA FEW (A) NONE SEEN   Squamous Epithelial / LPF 21-50 0 - 5   Mucus PRESENT    Budding Yeast PRESENT    Ca Oxalate Crys, UA PRESENT     RECHECK: - Feels much better after IVF and tylenol -COVID swab pending  Assessment and Plan  24 yo G1P0 at 37.[redacted] weeks EGA who is presenting to MAU complaining of chills, body aches, coughing, and loss of smell -COVID precautions given, COVID swab still pending -Quarantine for 10 days -Recommend 6-8 12 ounce water bottles of fluid daily, tylenol muscles aches -DC to home -Return precautions given  Michelle Gallagher 08/06/2019, 10:47 PM

## 2019-08-06 NOTE — MAU Note (Signed)
Pt reports to MAU c/o cough, sob, mucous, loss of smell. +FM. Pt reports body aches. No bleeding or LOF. Pt reports some occasional pelvic pressure only when she coughs.

## 2019-08-06 NOTE — Discharge Instructions (Signed)
COVID-19: Quarantine vs. Isolation QUARANTINE keeps someone who was in close contact with someone who has COVID-19 away from others. If you had close contact with a person who has COVID-19  Stay home until 14 days after your last contact.  Check your temperature twice a day and watch for symptoms of COVID-19.  If possible, stay away from people who are at higher-risk for getting very sick from COVID-19. ISOLATION keeps someone who is sick or tested positive for COVID-19 without symptoms away from others, even in their own home. If you are sick and think or know you have COVID-19  Stay home until after ? At least 10 days since symptoms first appeared and ? At least 24 hours with no fever without fever-reducing medication and ? Symptoms have improved If you tested positive for COVID-19 but do not have symptoms  Stay home until after ? 10 days have passed since your positive test If you live with others, stay in a specific "sick room" or area and away from other people or animals, including pets. Use a separate bathroom, if available. cdc.gov/coronavirus 10/25/2018 This information is not intended to replace advice given to you by your health care provider. Make sure you discuss any questions you have with your health care provider. Document Revised: 03/10/2019 Document Reviewed: 03/10/2019 Elsevier Patient Education  2020 [employer]     COVID-19 COVID-19 is a respiratory infection that is caused by a virus called severe acute respiratory syndrome coronavirus 2 (SARS-CoV-2). The disease is also known as coronavirus disease or novel coronavirus. In some people, the virus may not cause any symptoms. In others, it may cause a serious infection. The infection can get worse quickly and can lead to complications, such as:  Pneumonia, or infection of the lungs.  Acute respiratory distress syndrome or ARDS. This is a condition in which fluid build-up in the lungs prevents the lungs from  filling with air and passing oxygen into the blood.  Acute respiratory failure. This is a condition in which there is not enough oxygen passing from the lungs to the body or when carbon dioxide is not passing from the lungs out of the body.  Sepsis or septic shock. This is a serious bodily reaction to an infection.  Blood clotting problems.  Secondary infections due to bacteria or fungus.  Organ failure. This is when your body's organs stop working. The virus that causes COVID-19 is contagious. This means that it can spread from person to person through droplets from coughs and sneezes (respiratory secretions). What are the causes? This illness is caused by a virus. You may catch the virus by:  Breathing in droplets from an infected person. Droplets can be spread by a person breathing, speaking, singing, coughing, or sneezing.  Touching something, like a table or a doorknob, that was exposed to the virus (contaminated) and then touching your mouth, nose, or eyes. What increases the risk? Risk for infection You are more likely to be infected with this virus if you:  Are within 6 feet (2 meters) of a person with COVID-19.  Provide care for or live with a person who is infected with COVID-19.  Spend time in crowded indoor spaces or live in shared housing. Risk for serious illness You are more likely to become seriously ill from the virus if you:  Are 50 years of age or older. The higher your age, the more you are at risk for serious illness.  Live in a nursing home or long-term care   facility.  Have cancer.  Have a long-term (chronic) disease such as: ? Chronic lung disease, including chronic obstructive pulmonary disease or asthma. ? A long-term disease that lowers your body's ability to fight infection (immunocompromised). ? Heart disease, including heart failure, a condition in which the arteries that lead to the heart become narrow or blocked (coronary artery disease), a  disease which makes the heart muscle thick, weak, or stiff (cardiomyopathy). ? Diabetes. ? Chronic kidney disease. ? Sickle cell disease, a condition in which red blood cells have an abnormal "sickle" shape. ? Liver disease.  Are obese. What are the signs or symptoms? Symptoms of this condition can range from mild to severe. Symptoms may appear any time from 2 to 14 days after being exposed to the virus. They include:  A fever or chills.  A cough.  Difficulty breathing.  Headaches, body aches, or muscle aches.  Runny or stuffy (congested) nose.  A sore throat.  New loss of taste or smell. Some people may also have stomach problems, such as nausea, vomiting, or diarrhea. Other people may not have any symptoms of COVID-19. How is this diagnosed? This condition may be diagnosed based on:  Your signs and symptoms, especially if: ? You live in an area with a COVID-19 outbreak. ? You recently traveled to or from an area where the virus is common. ? You provide care for or live with a person who was diagnosed with COVID-19. ? You were exposed to a person who was diagnosed with COVID-19.  A physical exam.  Lab tests, which may include: ? Taking a sample of fluid from the back of your nose and throat (nasopharyngeal fluid), your nose, or your throat using a swab. ? A sample of mucus from your lungs (sputum). ? Blood tests.  Imaging tests, which may include, X-rays, CT scan, or ultrasound. How is this treated? At present, there is no medicine to treat COVID-19. Medicines that treat other diseases are being used on a trial basis to see if they are effective against COVID-19. Your health care provider will talk with you about ways to treat your symptoms. For most people, the infection is mild and can be managed at home with rest, fluids, and over-the-counter medicines. Treatment for a serious infection usually takes places in a hospital intensive care unit (ICU). It may include one  or more of the following treatments. These treatments are given until your symptoms improve.  Receiving fluids and medicines through an IV.  Supplemental oxygen. Extra oxygen is given through a tube in the nose, a face mask, or a hood.  Positioning you to lie on your stomach (prone position). This makes it easier for oxygen to get into the lungs.  Continuous positive airway pressure (CPAP) or bi-level positive airway pressure (BPAP) machine. This treatment uses mild air pressure to keep the airways open. A tube that is connected to a motor delivers oxygen to the body.  Ventilator. This treatment moves air into and out of the lungs by using a tube that is placed in your windpipe.  Tracheostomy. This is a procedure to create a hole in the neck so that a breathing tube can be inserted.  Extracorporeal membrane oxygenation (ECMO). This procedure gives the lungs a chance to recover by taking over the functions of the heart and lungs. It supplies oxygen to the body and removes carbon dioxide. Follow these instructions at home: Lifestyle  If you are sick, stay home except to get medical care. Your health   care provider will tell you how long to stay home. Call your health care provider before you go for medical care.  Rest at home as told by your health care provider.  Do not use any products that contain nicotine or tobacco, such as cigarettes, e-cigarettes, and chewing tobacco. If you need help quitting, ask your health care provider.  Return to your normal activities as told by your health care provider. Ask your health care provider what activities are safe for you. General instructions  Take over-the-counter and prescription medicines only as told by your health care provider.  Drink enough fluid to keep your urine pale yellow.  Keep all follow-up visits as told by your health care provider. This is important. How is this prevented?  There is no vaccine to help prevent COVID-19  infection. However, there are steps you can take to protect yourself and others from this virus. To protect yourself:   Do not travel to areas where COVID-19 is a risk. The areas where COVID-19 is reported change often. To identify high-risk areas and travel restrictions, check the CDC travel website: wwwnc.cdc.gov/travel/notices  If you live in, or must travel to, an area where COVID-19 is a risk, take precautions to avoid infection. ? Stay away from people who are sick. ? Wash your hands often with soap and water for 20 seconds. If soap and water are not available, use an alcohol-based hand sanitizer. ? Avoid touching your mouth, face, eyes, or nose. ? Avoid going out in public, follow guidance from your state and local health authorities. ? If you must go out in public, wear a cloth face covering or face mask. Make sure your mask covers your nose and mouth. ? Avoid crowded indoor spaces. Stay at least 6 feet (2 meters) away from others. ? Disinfect objects and surfaces that are frequently touched every day. This may include:  Counters and tables.  Doorknobs and light switches.  Sinks and faucets.  Electronics, such as phones, remote controls, keyboards, computers, and tablets. To protect others: If you have symptoms of COVID-19, take steps to prevent the virus from spreading to others.  If you think you have a COVID-19 infection, contact your health care provider right away. Tell your health care team that you think you may have a COVID-19 infection.  Stay home. Leave your house only to seek medical care. Do not use public transport.  Do not travel while you are sick.  Wash your hands often with soap and water for 20 seconds. If soap and water are not available, use alcohol-based hand sanitizer.  Stay away from other members of your household. Let healthy household members care for children and pets, if possible. If you have to care for children or pets, wash your hands often and  wear a mask. If possible, stay in your own room, separate from others. Use a different bathroom.  Make sure that all people in your household wash their hands well and often.  Cough or sneeze into a tissue or your sleeve or elbow. Do not cough or sneeze into your hand or into the air.  Wear a cloth face covering or face mask. Make sure your mask covers your nose and mouth. Where to find more information  Centers for Disease Control and Prevention: www.cdc.gov/coronavirus/2019-ncov/index.html  World Health Organization: www.who.int/health-topics/coronavirus Contact a health care provider if:  You live in or have traveled to an area where COVID-19 is a risk and you have symptoms of the infection.  You have   had contact with someone who has COVID-19 and you have symptoms of the infection. Get help right away if:  You have trouble breathing.  You have pain or pressure in your chest.  You have confusion.  You have bluish lips and fingernails.  You have difficulty waking from sleep.  You have symptoms that get worse. These symptoms may represent a serious problem that is an emergency. Do not wait to see if the symptoms will go away. Get medical help right away. Call your local emergency services (911 in the [place] Do not drive yourself to the hospital. Let the emergency medical personnel know if you think you have COVID-19. Summary  COVID-19 is a respiratory infection that is caused by a virus. It is also known as coronavirus disease or novel coronavirus. It can cause serious infections, such as pneumonia, acute respiratory distress syndrome, acute respiratory failure, or sepsis.  The virus that causes COVID-19 is contagious. This means that it can spread from person to person through droplets from breathing, speaking, singing, coughing, or sneezing.  You are more likely to develop a serious illness if you are 50 years of age or older, have a weak immune system, live in a nursing home,  or have chronic disease.  There is no medicine to treat COVID-19. Your health care provider will talk with you about ways to treat your symptoms.  Take steps to protect yourself and others from infection. Wash your hands often and disinfect objects and surfaces that are frequently touched every day. Stay away from people who are sick and wear a mask if you are sick. This information is not intended to replace advice given to you by your health care provider. Make sure you discuss any questions you have with your health care provider. Document Revised: 01/21/2019 Document Reviewed: 04/29/2018 Elsevier Patient Education  2020 Elsevier Inc.  

## 2019-08-07 DIAGNOSIS — U071 COVID-19: Secondary | ICD-10-CM | POA: Diagnosis present

## 2019-08-07 DIAGNOSIS — O98513 Other viral diseases complicating pregnancy, third trimester: Principal | ICD-10-CM | POA: Diagnosis present

## 2019-08-07 LAB — RESPIRATORY PANEL BY RT PCR (FLU A&B, COVID)
Influenza A by PCR: NEGATIVE
Influenza B by PCR: NEGATIVE
SARS Coronavirus 2 by RT PCR: POSITIVE — AB

## 2019-08-09 ENCOUNTER — Inpatient Hospital Stay (HOSPITAL_COMMUNITY)
Admission: EM | Admit: 2019-08-09 | Discharge: 2019-08-14 | DRG: 831 | Disposition: A | Discharge status: Home / Self Care | Payer: Medicaid Other | Attending: Obstetrics and Gynecology | Admitting: Obstetrics and Gynecology

## 2019-08-09 ENCOUNTER — Encounter (HOSPITAL_COMMUNITY): Payer: Self-pay | Admitting: Emergency Medicine

## 2019-08-09 ENCOUNTER — Other Ambulatory Visit: Payer: Self-pay

## 2019-08-09 ENCOUNTER — Emergency Department (HOSPITAL_COMMUNITY): Payer: Medicaid Other

## 2019-08-09 DIAGNOSIS — J9601 Acute respiratory failure with hypoxia: Secondary | ICD-10-CM | POA: Diagnosis present

## 2019-08-09 DIAGNOSIS — O409XX0 Polyhydramnios, unspecified trimester, not applicable or unspecified: Secondary | ICD-10-CM | POA: Diagnosis present

## 2019-08-09 DIAGNOSIS — Z87891 Personal history of nicotine dependence: Secondary | ICD-10-CM | POA: Diagnosis not present

## 2019-08-09 DIAGNOSIS — O99013 Anemia complicating pregnancy, third trimester: Secondary | ICD-10-CM | POA: Diagnosis present

## 2019-08-09 DIAGNOSIS — Z362 Encounter for other antenatal screening follow-up: Secondary | ICD-10-CM | POA: Diagnosis not present

## 2019-08-09 DIAGNOSIS — O99213 Obesity complicating pregnancy, third trimester: Secondary | ICD-10-CM | POA: Diagnosis present

## 2019-08-09 DIAGNOSIS — O99513 Diseases of the respiratory system complicating pregnancy, third trimester: Secondary | ICD-10-CM | POA: Diagnosis present

## 2019-08-09 DIAGNOSIS — O98513 Other viral diseases complicating pregnancy, third trimester: Secondary | ICD-10-CM | POA: Diagnosis present

## 2019-08-09 DIAGNOSIS — Z3A37 37 weeks gestation of pregnancy: Secondary | ICD-10-CM

## 2019-08-09 DIAGNOSIS — Z3A38 38 weeks gestation of pregnancy: Secondary | ICD-10-CM | POA: Diagnosis not present

## 2019-08-09 DIAGNOSIS — D509 Iron deficiency anemia, unspecified: Secondary | ICD-10-CM | POA: Diagnosis present

## 2019-08-09 DIAGNOSIS — O0993 Supervision of high risk pregnancy, unspecified, third trimester: Secondary | ICD-10-CM

## 2019-08-09 DIAGNOSIS — O280 Abnormal hematological finding on antenatal screening of mother: Secondary | ICD-10-CM | POA: Diagnosis present

## 2019-08-09 DIAGNOSIS — J1282 Pneumonia due to coronavirus disease 2019: Secondary | ICD-10-CM | POA: Diagnosis present

## 2019-08-09 DIAGNOSIS — O0990 Supervision of high risk pregnancy, unspecified, unspecified trimester: Secondary | ICD-10-CM

## 2019-08-09 DIAGNOSIS — O99210 Obesity complicating pregnancy, unspecified trimester: Secondary | ICD-10-CM | POA: Diagnosis present

## 2019-08-09 DIAGNOSIS — U071 COVID-19: Secondary | ICD-10-CM | POA: Diagnosis present

## 2019-08-09 DIAGNOSIS — O403XX0 Polyhydramnios, third trimester, not applicable or unspecified: Secondary | ICD-10-CM | POA: Diagnosis present

## 2019-08-09 DIAGNOSIS — J9690 Respiratory failure, unspecified, unspecified whether with hypoxia or hypercapnia: Secondary | ICD-10-CM | POA: Diagnosis not present

## 2019-08-09 LAB — FERRITIN: Ferritin: 33 ng/mL (ref 11–307)

## 2019-08-09 LAB — CBC
HCT: 37.8 % (ref 36.0–46.0)
Hemoglobin: 11.7 g/dL — ABNORMAL LOW (ref 12.0–15.0)
MCH: 23.1 pg — ABNORMAL LOW (ref 26.0–34.0)
MCHC: 31 g/dL (ref 30.0–36.0)
MCV: 74.7 fL — ABNORMAL LOW (ref 80.0–100.0)
Platelets: 162 10*3/uL (ref 150–400)
RBC: 5.06 MIL/uL (ref 3.87–5.11)
RDW: 18.8 % — ABNORMAL HIGH (ref 11.5–15.5)
WBC: 7.1 10*3/uL (ref 4.0–10.5)
nRBC: 0 % (ref 0.0–0.2)

## 2019-08-09 LAB — COMPREHENSIVE METABOLIC PANEL
ALT: 9 U/L (ref 0–44)
ALT: 10 U/L (ref 0–44)
AST: 20 U/L (ref 15–41)
AST: 21 U/L (ref 15–41)
Albumin: 2.5 g/dL — ABNORMAL LOW (ref 3.5–5.0)
Albumin: 2.5 g/dL — ABNORMAL LOW (ref 3.5–5.0)
Alkaline Phosphatase: 173 U/L — ABNORMAL HIGH (ref 38–126)
Alkaline Phosphatase: 173 U/L — ABNORMAL HIGH (ref 38–126)
Anion gap: 13 (ref 5–15)
Anion gap: 13 (ref 5–15)
BUN: <5 mg/dL — ABNORMAL LOW (ref 6–20)
BUN: 5 mg/dL — ABNORMAL LOW (ref 6–20)
CO2: 16 mmol/L — ABNORMAL LOW (ref 22–32)
CO2: 16 mmol/L — ABNORMAL LOW (ref 22–32)
Calcium: 8.7 mg/dL — ABNORMAL LOW (ref 8.9–10.3)
Calcium: 8.7 mg/dL — ABNORMAL LOW (ref 8.9–10.3)
Chloride: 106 mmol/L (ref 98–111)
Chloride: 107 mmol/L (ref 98–111)
Creatinine, Ser: 0.99 mg/dL (ref 0.44–1.00)
Creatinine, Ser: 0.96 mg/dL (ref 0.44–1.00)
GFR calc Af Amer: >60 mL/min (ref >60)
GFR calc Af Amer: >60 mL/min (ref >60)
GFR calc non Af Amer: >60 mL/min (ref >60)
GFR calc non Af Amer: >60 mL/min (ref >60)
Glucose, Bld: 83 mg/dL (ref 70–99)
Glucose, Bld: 79 mg/dL (ref 70–99)
Potassium: 3.8 mmol/L (ref 3.5–5.1)
Potassium: 3.9 mmol/L (ref 3.5–5.1)
Sodium: 135 mmol/L (ref 135–145)
Sodium: 136 mmol/L (ref 135–145)
Total Bilirubin: 0.7 mg/dL (ref 0.3–1.2)
Total Bilirubin: 0.8 mg/dL (ref 0.3–1.2)
Total Protein: 6.4 g/dL — ABNORMAL LOW (ref 6.5–8.1)
Total Protein: 6.5 g/dL (ref 6.5–8.1)

## 2019-08-09 LAB — CBC WITH DIFFERENTIAL/PLATELET
Abs Immature Granulocytes: 0.13 10*3/uL — ABNORMAL HIGH (ref 0.00–0.07)
Basophils Absolute: 0 10*3/uL (ref 0.0–0.1)
Basophils Relative: 0 %
Eosinophils Absolute: 0 10*3/uL (ref 0.0–0.5)
Eosinophils Relative: 0 %
HCT: 37 % (ref 36.0–46.0)
Hemoglobin: 12 g/dL (ref 12.0–15.0)
Immature Granulocytes: 2 %
Lymphocytes Relative: 27 %
Lymphs Abs: 1.7 10*3/uL (ref 0.7–4.0)
MCH: 24 pg — ABNORMAL LOW (ref 26.0–34.0)
MCHC: 32.4 g/dL (ref 30.0–36.0)
MCV: 73.9 fL — ABNORMAL LOW (ref 80.0–100.0)
Monocytes Absolute: 0.2 10*3/uL (ref 0.1–1.0)
Monocytes Relative: 4 %
Neutro Abs: 4.1 10*3/uL (ref 1.7–7.7)
Neutrophils Relative %: 67 %
Platelets: 170 10*3/uL (ref 150–400)
RBC: 5.01 MIL/uL (ref 3.87–5.11)
RDW: 18.4 % — ABNORMAL HIGH (ref 11.5–15.5)
WBC: 6.1 10*3/uL (ref 4.0–10.5)
nRBC: 0 % (ref 0.0–0.2)

## 2019-08-09 LAB — I-STAT BETA HCG BLOOD, ED (MC, WL, AP ONLY): I-stat hCG, quantitative: >2000 m[IU]/mL — ABNORMAL HIGH (ref <5)

## 2019-08-09 LAB — URINALYSIS, ROUTINE W REFLEX MICROSCOPIC
Bilirubin Urine: NEGATIVE
Glucose, UA: NEGATIVE mg/dL
Hgb urine dipstick: NEGATIVE
Ketones, ur: 80 mg/dL — AB
Leukocytes,Ua: NEGATIVE
Nitrite: NEGATIVE
Protein, ur: 100 mg/dL — AB
Specific Gravity, Urine: 1.028 (ref 1.005–1.030)
pH: 5 (ref 5.0–8.0)

## 2019-08-09 LAB — TYPE AND SCREEN
ABO/RH(D): O POS
Antibody Screen: NEGATIVE

## 2019-08-09 LAB — D-DIMER, QUANTITATIVE: D-Dimer, Quant: 2.78 ug/mL-FEU — ABNORMAL HIGH (ref 0.00–0.50)

## 2019-08-09 LAB — BRAIN NATRIURETIC PEPTIDE: B Natriuretic Peptide: 41.7 pg/mL (ref 0.0–100.0)

## 2019-08-09 LAB — LIPASE, BLOOD: Lipase: 33 U/L (ref 11–51)

## 2019-08-09 LAB — PROCALCITONIN: Procalcitonin: 0.15 ng/mL

## 2019-08-09 LAB — HEPATITIS B SURFACE ANTIGEN: Hepatitis B Surface Ag: NONREACTIVE

## 2019-08-09 LAB — LACTIC ACID, PLASMA: Lactic Acid, Venous: 1.1 mmol/L (ref 0.5–1.9)

## 2019-08-09 LAB — C-REACTIVE PROTEIN: CRP: 6.3 mg/dL — ABNORMAL HIGH (ref <1.0)

## 2019-08-09 LAB — LACTATE DEHYDROGENASE: LDH: 191 U/L (ref 98–192)

## 2019-08-09 MED ORDER — GUAIFENESIN-DM 100-10 MG/5ML PO SYRP
10.0000 mL | ORAL_SOLUTION | ORAL | Status: DC | PRN
  Administered 2019-08-09 – 2019-08-12 (×7): 10 mL via ORAL
  Filled 2019-08-09 (×8): qty 10

## 2019-08-09 MED ORDER — ZINC SULFATE 220 (50 ZN) MG PO CAPS
220.0000 mg | ORAL_CAPSULE | Freq: Every day | ORAL | Status: DC
  Administered 2019-08-09 – 2019-08-14 (×6): 220 mg via ORAL
  Filled 2019-08-09 (×6): qty 1

## 2019-08-09 MED ORDER — HYDROCOD POLST-CPM POLST ER 10-8 MG/5ML PO SUER: 5.0000 mL | Freq: Two times a day (BID) | ORAL | Status: DC | PRN

## 2019-08-09 MED ORDER — FLUCONAZOLE 150 MG PO TABS
150.0000 mg | ORAL_TABLET | Freq: Once | ORAL | Status: AC
  Administered 2019-08-09: 20:00:00 150 mg via ORAL
  Filled 2019-08-09: qty 1

## 2019-08-09 MED ORDER — ACETAMINOPHEN 325 MG PO TABS
650.0000 mg | ORAL_TABLET | ORAL | Status: DC | PRN
  Administered 2019-08-11: 650 mg via ORAL
  Filled 2019-08-09: qty 2

## 2019-08-09 MED ORDER — CALCIUM CARBONATE ANTACID 500 MG PO CHEW
2.0000 | CHEWABLE_TABLET | ORAL | Status: DC | PRN
  Administered 2019-08-11 – 2019-08-13 (×2): 400 mg via ORAL
  Filled 2019-08-09 (×2): qty 2

## 2019-08-09 MED ORDER — SODIUM CHLORIDE 0.9% FLUSH: 3.0000 mL | Freq: Once | INTRAVENOUS | Status: DC

## 2019-08-09 MED ORDER — ENOXAPARIN SODIUM 80 MG/0.8ML ~~LOC~~ SOLN
70.0000 mg | SUBCUTANEOUS | Status: DC
  Administered 2019-08-09 – 2019-08-13 (×5): 70 mg via SUBCUTANEOUS
  Filled 2019-08-09 (×5): qty 0.8
  Filled 2019-08-09: qty 0.7

## 2019-08-09 MED ORDER — DEXAMETHASONE SODIUM PHOSPHATE 10 MG/ML IJ SOLN
6.0000 mg | INTRAMUSCULAR | Status: DC
  Administered 2019-08-09 – 2019-08-13 (×5): 6 mg via INTRAVENOUS
  Filled 2019-08-09: qty 0.6
  Filled 2019-08-09 (×5): qty 1

## 2019-08-09 MED ORDER — PRENATAL MULTIVITAMIN CH
1.0000 | ORAL_TABLET | Freq: Every day | ORAL | Status: DC
  Administered 2019-08-10 – 2019-08-13 (×4): 1 via ORAL
  Filled 2019-08-09 (×4): qty 1

## 2019-08-09 MED ORDER — ZOLPIDEM TARTRATE 5 MG PO TABS
5.0000 mg | ORAL_TABLET | Freq: Every evening | ORAL | Status: DC | PRN
  Administered 2019-08-10 – 2019-08-11 (×2): 5 mg via ORAL
  Filled 2019-08-09 (×2): qty 1

## 2019-08-09 MED ORDER — IPRATROPIUM-ALBUTEROL 20-100 MCG/ACT IN AERS
1.0000 | INHALATION_SPRAY | Freq: Four times a day (QID) | RESPIRATORY_TRACT | Status: DC
  Administered 2019-08-09 – 2019-08-14 (×18): 1 via RESPIRATORY_TRACT
  Filled 2019-08-09: qty 4

## 2019-08-09 MED ORDER — NITROFURANTOIN MONOHYD MACRO 100 MG PO CAPS: 100.0000 mg | ORAL_CAPSULE | Freq: Two times a day (BID) | ORAL | Status: DC

## 2019-08-09 MED ORDER — ASCORBIC ACID 500 MG PO TABS
500.0000 mg | ORAL_TABLET | Freq: Every day | ORAL | Status: DC
  Administered 2019-08-09 – 2019-08-14 (×6): 500 mg via ORAL
  Filled 2019-08-09 (×6): qty 1

## 2019-08-09 MED ORDER — DOCUSATE SODIUM 100 MG PO CAPS
100.0000 mg | ORAL_CAPSULE | Freq: Every day | ORAL | Status: DC
  Administered 2019-08-10 – 2019-08-14 (×5): 100 mg via ORAL
  Filled 2019-08-09 (×5): qty 1

## 2019-08-09 MED ORDER — LACTATED RINGERS IV SOLN: INTRAVENOUS | Status: DC

## 2019-08-09 MED ORDER — IPRATROPIUM-ALBUTEROL 0.5-2.5 (3) MG/3ML IN SOLN
3.0000 mL | Freq: Four times a day (QID) | RESPIRATORY_TRACT | Status: DC
  Filled 2019-08-09 (×3): qty 3

## 2019-08-09 NOTE — [facility]U Provider Note (Signed)
History     CSN: [id number]  Arrival date and time: 08/09/19 1237   First Provider Initiated Contact with Patient 08/09/19 1545      Chief Complaint  Patient presents with  . Emesis   24 y.o. G1 '@37' .5 wks presenting with N/V/D and low abdominal cramping. Reports onset of watery diarrhea 2 days ago. Reports onset of bloody emesis today. After arrival to ED she c/o low abd cramping. She denies sick contacts but lives in group home with other pregnant women. She was seen 3 days ago in MAU for cough and tested positive for Covid. Reports productive cough x4 days and loss of smell. She reports SOB at times, mostly when lying down. Also reports decreased FM today. Denies VB, LOF, or ctx. Denies urinary sx.    OB History    Gravida  1   Para  0   Term  0   Preterm  0   AB  0   Living  0     SAB  0   TAB  0   Ectopic  0   Multiple  0   Live Births  0           Past Medical History:  Diagnosis Date  . Asthma     Past Surgical History:  Procedure Laterality Date  . NO PAST SURGERIES      Family History  Problem Relation Age of Onset  . Multiple sclerosis Mother     Social History   Tobacco Use  . Smoking status: Former Smoker    Packs/day: 0.10    Types: Cigarettes    Quit date: 12/07/2018    Years since quitting: 0.6  . Smokeless tobacco: Never Used  Substance Use Topics  . Alcohol use: Not Currently  . Drug use: No    Allergies:  Allergies  Allergen Reactions  . Pollen Extract     Medications Prior to Admission  Medication Sig Dispense Refill Last Dose  . acetaminophen (TYLENOL) 500 MG tablet Take 500 mg by mouth every 6 (six) hours as needed.     . Blood Pressure Monitoring (BLOOD PRESSURE MONITOR AUTOMAT) DEVI 1 Device by Does not apply route daily. 1 kit 0   . ferrous sulfate 325 (65 FE) MG EC tablet Take 1 tablet (325 mg total) by mouth daily with breakfast. (Patient not taking: Reported on 07/01/2019) 45 tablet 3   . Misc. Devices (GOJJI  WEIGHT SCALE) MISC 1 Device by Does not apply route daily as needed. To weight self daily as needed at home. ICD-10 code: O09.90 1 each 0   . Prenatal Vit-Fe Fumarate-FA (PREPLUS) 27-1 MG TABS Take 1 tablet by mouth daily. 60 tablet 6     Review of Systems  Constitutional: Negative for chills and fever.  Respiratory: Positive for cough and shortness of breath.   Cardiovascular: Negative for chest pain.  Gastrointestinal: Positive for abdominal pain, diarrhea, nausea and vomiting.  Genitourinary: Negative for dysuria, vaginal bleeding and vaginal discharge.   Physical Exam   Blood pressure 139/77, pulse 95, temperature 99.2 F (37.3 C), temperature source Oral, resp. rate (!) 38, height '5\' 5"'  (1.651 m), weight 131.5 kg, last menstrual period 11/11/2018, SpO2 99 %. Patient Vitals for the past 24 hrs:  BP Temp Temp src Pulse Resp SpO2 Height Weight  08/09/19 1704 139/77 -- -- 95 -- 99 % -- --  08/09/19 1700 -- -- -- -- -- 100 % -- --  08/09/19 1655 -- -- -- -- --  100 % -- --  08/09/19 1605 -- -- -- -- (!) 38 -- -- --  08/09/19 1523 (!) 89/51 99.2 F (37.3 C) Oral (!) 104 20 100 % -- --  08/09/19 1358 104/68 -- -- (!) 108 20 100 % -- --  08/09/19 1239 112/66 98.7 F (37.1 C) Oral (!) 105 (!) 22 98 % '5\' 5"'  (1.651 m) 131.5 kg   Physical Exam  Constitutional: She is oriented to person, place, and time. She appears well-developed and well-nourished. She appears distressed (mild).  HENT:  Head: Normocephalic and atraumatic.  Cardiovascular: Regular rhythm and normal heart sounds.  Tachy 110s  Respiratory: Breath sounds normal. Tachypnea noted. No respiratory distress. She has no wheezes. She has no rales.  Increased WOB  GI: Soft. She exhibits no distension. There is no abdominal tenderness.  Genitourinary:    Genitourinary Comments: VE: closed/thick   Musculoskeletal:        General: No edema. Normal range of motion.     Cervical back: Normal range of motion.  Neurological: She is  alert and oriented to person, place, and time.  Skin: Skin is warm and dry.  Psychiatric: She has a normal mood and affect.  EFM: 145 bpm, mod variability, + accels, no decels Toco: none  Results for orders placed or performed during the hospital encounter of 08/09/19 (from the past 24 hour(s))  Lipase, blood     Status: None   Collection Time: 08/09/19 12:50 PM  Result Value Ref Range   Lipase 33 11 - 51 U/L  Comprehensive metabolic panel     Status: Abnormal   Collection Time: 08/09/19 12:50 PM  Result Value Ref Range   Sodium 135 135 - 145 mmol/L   Potassium 3.8 3.5 - 5.1 mmol/L   Chloride 106 98 - 111 mmol/L   CO2 16 (L) 22 - 32 mmol/L   Glucose, Bld 83 70 - 99 mg/dL   BUN <5 (L) 6 - 20 mg/dL   Creatinine, Ser 0.99 0.44 - 1.00 mg/dL   Calcium 8.7 (L) 8.9 - 10.3 mg/dL   Total Protein 6.4 (L) 6.5 - 8.1 g/dL   Albumin 2.5 (L) 3.5 - 5.0 g/dL   AST 20 15 - 41 U/L   ALT 9 0 - 44 U/L   Alkaline Phosphatase 173 (H) 38 - 126 U/L   Total Bilirubin 0.7 0.3 - 1.2 mg/dL   GFR calc non Af Amer >60 >60 mL/min   GFR calc Af Amer >60 >60 mL/min   Anion gap 13 5 - 15  CBC     Status: Abnormal   Collection Time: 08/09/19 12:50 PM  Result Value Ref Range   WBC 7.1 4.0 - 10.5 K/uL   RBC 5.06 3.87 - 5.11 MIL/uL   Hemoglobin 11.7 (L) 12.0 - 15.0 g/dL   HCT 37.8 36.0 - 46.0 %   MCV 74.7 (L) 80.0 - 100.0 fL   MCH 23.1 (L) 26.0 - 34.0 pg   MCHC 31.0 30.0 - 36.0 g/dL   RDW 18.8 (H) 11.5 - 15.5 %   Platelets 162 150 - 400 K/uL   nRBC 0.0 0.0 - 0.2 %  I-Stat beta hCG blood, ED     Status: Abnormal   Collection Time: 08/09/19 12:58 PM  Result Value Ref Range   I-stat hCG, quantitative >2,000.0 (H) <5 mIU/mL   Comment 3          Urinalysis, Routine w reflex microscopic     Status: Abnormal  Collection Time: 08/09/19  3:30 PM  Result Value Ref Range   Color, Urine AMBER (A) YELLOW   APPearance CLOUDY (A) CLEAR   Specific Gravity, Urine 1.028 1.005 - 1.030   pH 5.0 5.0 - 8.0   Glucose, UA  NEGATIVE NEGATIVE mg/dL   Hgb urine dipstick NEGATIVE NEGATIVE   Bilirubin Urine NEGATIVE NEGATIVE   Ketones, ur 80 (A) NEGATIVE mg/dL   Protein, ur 100 (A) NEGATIVE mg/dL   Nitrite NEGATIVE NEGATIVE   Leukocytes,Ua NEGATIVE NEGATIVE   WBC, UA 11-20 0 - 5 WBC/hpf   Bacteria, UA MANY (A) NONE SEEN   Squamous Epithelial / LPF 21-50 0 - 5   Mucus PRESENT    Budding Yeast PRESENT    DG Chest 1 View  Result Date: 08/09/2019 CLINICAL DATA:  Tachypnea. COVID-19 positive. EXAM: CHEST  1 VIEW COMPARISON:  11/17/2010 FINDINGS: There is scattered bilateral airspace opacities. There is no pneumothorax. No large pleural effusion. The heart size is normal. The lung volumes are somewhat low. There is no acute osseous abnormality. IMPRESSION: Multifocal airspace opacities concerning for multifocal pneumonia (viral or bacterial). Electronically Signed   By: Constance Holster M.D.   On: 08/09/2019 17:03   MAU Course  Procedures  MDM Labs and imaging ordered and reviewed. Discussed presentation and findings with Dr. Harolyn Rutherford, plan for admit.  Assessment and Plan  [redacted] weeks gestation Covid pneumonia Admit to [facility] unit [facility] per MD  Julianne Handler, CNM 08/09/2019, 5:23 PM

## 2019-08-09 NOTE — H&P (Addendum)
FACULTY PRACTICE ANTEPARTUM ADMISSION HISTORY AND PHYSICAL NOTE   History of Present Illness: Michelle Gallagher is a 24 y.o. G1P0000 at 23w5dadmitted for COVID pneumonia. Patient was diagnosed during MAU evaluation on 08/06/19, her symptoms were mild and she was discharged to home. She returned to the ED today with worsening respiratory symptoms, nausea, vomiting, lower abdominal cramping and diarrhea. Reports onset of watery diarrhea 2 days ago. Reports onset of bloody emesis earlier today. After arrival to ED, she reported low abdominal cramping. She denied other sick contacts but lives in group home with other pregnant women. Reports productive cough x4 days and loss of smell. She reports SOB at times, mostly when lying down. Denies any urinary symptoms. Patient reports the fetal movement as decreased . Patient reports uterine contraction  activity as none. Patient reports  vaginal bleeding as none. Patient describes fluid per vagina as none. Fetal presentation is unsure.  Patient Active Problem List   Diagnosis Date Noted  . Pneumonia due to COVID-19 virus 08/09/2019  . COVID-19 affecting pregnancy in third trimester 08/07/2019  . Polyhydramnios affecting pregnancy 08/03/2019  . Abnormal antenatal AFP screen 03/15/2019  . Maternal morbid obesity, antepartum ([place] 02/04/2019  . Supervision of high-risk pregnancy 01/24/2019    Past Medical History:  Diagnosis Date  . Asthma     Past Surgical History:  Procedure Laterality Date  . NO PAST SURGERIES      OB History  Gravida Para Term Preterm AB Living  1 0 0 0 0 0  SAB TAB Ectopic Multiple Live Births  0 0 0 0 0    # Outcome Date GA Lbr Len/2nd Weight Sex Delivery Anes PTL Lv  1 Current             Social History   Socioeconomic History  . Marital status: Single    Spouse name: Not on file  . Number of children: Not on file  . Years of education: Not on file  . Highest education level: GED or equivalent  Occupational  History  . Not on file  Tobacco Use  . Smoking status: Former Smoker    Packs/day: 0.10    Types: Cigarettes    Quit date: 12/07/2018    Years since quitting: 0.6  . Smokeless tobacco: Never Used  Substance and Sexual Activity  . Alcohol use: Not Currently  . Drug use: No  . Sexual activity: Not Currently    Birth control/protection: None  Other Topics Concern  . Not on file  Social History Narrative  . Not on file   Social Determinants of Health   Financial Resource Strain: Medium Risk  . Difficulty of Paying Living Expenses: Somewhat hard  Food Insecurity: Food Insecurity Present  . Worried About RCharity fundraiserin the Last Year: Sometimes true  . Ran Out of Food in the Last Year: Sometimes true  Transportation Needs: Unmet Transportation Needs  . Lack of Transportation (Medical): Yes  . Lack of Transportation (Non-Medical): Yes  Physical Activity: Inactive  . Days of Exercise per Week: 0 days  . Minutes of Exercise per Session: 0 min  Stress: No Stress Concern Present  . Feeling of Stress : Not at all  Social Connections: Moderately Isolated  . Frequency of Communication with Friends and Family: More than three times a week  . Frequency of Social Gatherings with Friends and Family: More than three times a week  . Attends Religious Services: Never  . Active Member of Clubs or  Organizations: No  . Attends Archivist Meetings: Never  . Marital Status: Never married    Family History  Problem Relation Age of Onset  . Multiple sclerosis Mother     Allergies  Allergen Reactions  . Pollen Extract     Medications Prior to Admission  Medication Sig Dispense Refill Last Dose  . acetaminophen (TYLENOL) 500 MG tablet Take 500 mg by mouth every 6 (six) hours as needed.     . Blood Pressure Monitoring (BLOOD PRESSURE MONITOR AUTOMAT) DEVI 1 Device by Does not apply route daily. 1 kit 0   . ferrous sulfate 325 (65 FE) MG EC tablet Take 1 tablet (325 mg total)  by mouth daily with breakfast. (Patient not taking: Reported on 07/01/2019) 45 tablet 3   . Misc. Devices (GOJJI WEIGHT SCALE) MISC 1 Device by Does not apply route daily as needed. To weight self daily as needed at home. ICD-10 code: O09.90 1 each 0   . Prenatal Vit-Fe Fumarate-FA (PREPLUS) 27-1 MG TABS Take 1 tablet by mouth daily. 60 tablet 6     Review of Systems - Negative except what is in HPI  Vitals:  BP 115/72   Pulse 99   Temp 98.4 F (36.9 C)   Resp (!) 33   Ht '5\' 5"'  (1.651 m)   Wt 131.5 kg   LMP 11/11/2018 (Exact Date)   SpO2 99%   BMI 48.26 kg/m   Patient Vitals for the past 24 hrs:  BP Temp Temp src Pulse Resp SpO2 Height Weight  08/09/19 1853 (!) 105/57 -- -- (!) 114 (!) 38 100 % -- --  08/09/19 1725 115/72 98.4 F (36.9 C) -- 99 (!) 33 99 % -- --  08/09/19 1704 139/77 -- -- 95 -- 99 % -- --  08/09/19 1700 -- -- -- -- -- 100 % -- --  08/09/19 1655 -- -- -- -- -- 100 % -- --  08/09/19 1605 -- -- -- -- (!) 38 -- -- --  08/09/19 1523 (!) 89/51 99.2 F (37.3 C) Oral (!) 104 20 100 % -- --  08/09/19 1358 104/68 -- -- (!) 108 20 100 % -- --  08/09/19 1239 112/66 98.7 F (37.1 C) Oral (!) 105 (!) 22 98 % '5\' 5"'  (1.651 m) 131.5 kg  Physical Examination (by Julianne Handler, CNM): Constitutional: She is oriented to person, place, and time. She appears well-developed and well-nourished. She appears distressed (mild).  Head: Normocephalic and atraumatic.  Cardiovascular: Regular rhythm and normal heart sounds. Tachy 110s  Respiratory: Breath sounds normal. Tachypnea noted. No respiratory distress. She has no wheezes. She has no rales. Increased WOB  GI: Soft. She exhibits no distension. There is no abdominal tenderness.  Genitourinary:    Genitourinary Comments: VE: Closed/thick   Musculoskeletal:  No edema. Normal range of motion.  Neurological: She is alert and oriented to person, place, and time.  Skin: Skin is warm and dry.  Psychiatric: She has a normal mood and  affect.  EFM: 145 bpm, mod variability, + accels, no decels Toco: none  Labs:  Results for orders placed or performed during the hospital encounter of 08/09/19 (from the past 168 hour(s))  Lipase, blood   Collection Time: 08/09/19 12:50 PM  Result Value Ref Range   Lipase 33 11 - 51 U/L  Comprehensive metabolic panel   Collection Time: 08/09/19 12:50 PM  Result Value Ref Range   Sodium 135 135 - 145 mmol/L   Potassium 3.8 3.5 -  5.1 mmol/L   Chloride 106 98 - 111 mmol/L   CO2 16 (L) 22 - 32 mmol/L   Glucose, Bld 83 70 - 99 mg/dL   BUN <5 (L) 6 - 20 mg/dL   Creatinine, Ser 0.99 0.44 - 1.00 mg/dL   Calcium 8.7 (L) 8.9 - 10.3 mg/dL   Total Protein 6.4 (L) 6.5 - 8.1 g/dL   Albumin 2.5 (L) 3.5 - 5.0 g/dL   AST 20 15 - 41 U/L   ALT 9 0 - 44 U/L   Alkaline Phosphatase 173 (H) 38 - 126 U/L   Total Bilirubin 0.7 0.3 - 1.2 mg/dL   GFR calc non Af Amer >60 >60 mL/min   GFR calc Af Amer >60 >60 mL/min   Anion gap 13 5 - 15  CBC   Collection Time: 08/09/19 12:50 PM  Result Value Ref Range   WBC 7.1 4.0 - 10.5 K/uL   RBC 5.06 3.87 - 5.11 MIL/uL   Hemoglobin 11.7 (L) 12.0 - 15.0 g/dL   HCT 37.8 36.0 - 46.0 %   MCV 74.7 (L) 80.0 - 100.0 fL   MCH 23.1 (L) 26.0 - 34.0 pg   MCHC 31.0 30.0 - 36.0 g/dL   RDW 18.8 (H) 11.5 - 15.5 %   Platelets 162 150 - 400 K/uL   nRBC 0.0 0.0 - 0.2 %  I-Stat beta hCG blood, ED   Collection Time: 08/09/19 12:58 PM  Result Value Ref Range   I-stat hCG, quantitative >2,000.0 (H) <5 mIU/mL   Comment 3          Urinalysis, Routine w reflex microscopic   Collection Time: 08/09/19  3:30 PM  Result Value Ref Range   Color, Urine AMBER (A) YELLOW   APPearance CLOUDY (A) CLEAR   Specific Gravity, Urine 1.028 1.005 - 1.030   pH 5.0 5.0 - 8.0   Glucose, UA NEGATIVE NEGATIVE mg/dL   Hgb urine dipstick NEGATIVE NEGATIVE   Bilirubin Urine NEGATIVE NEGATIVE   Ketones, ur 80 (A) NEGATIVE mg/dL   Protein, ur 100 (A) NEGATIVE mg/dL   Nitrite NEGATIVE NEGATIVE    Leukocytes,Ua NEGATIVE NEGATIVE   WBC, UA 11-20 0 - 5 WBC/hpf   Bacteria, UA MANY (A) NONE SEEN   Squamous Epithelial / LPF 21-50 0 - 5   Mucus PRESENT    Budding Yeast PRESENT   CBC with Differential/Platelet   Collection Time: 08/09/19  6:30 PM  Result Value Ref Range   WBC 6.1 4.0 - 10.5 K/uL   RBC 5.01 3.87 - 5.11 MIL/uL   Hemoglobin 12.0 12.0 - 15.0 g/dL   HCT 37.0 36.0 - 46.0 %   MCV 73.9 (L) 80.0 - 100.0 fL   MCH 24.0 (L) 26.0 - 34.0 pg   MCHC 32.4 30.0 - 36.0 g/dL   RDW 18.4 (H) 11.5 - 15.5 %   Platelets 170 150 - 400 K/uL   nRBC 0.0 0.0 - 0.2 %   Neutrophils Relative % 67 %   Neutro Abs 4.1 1.7 - 7.7 K/uL   Lymphocytes Relative 27 %   Lymphs Abs 1.7 0.7 - 4.0 K/uL   Monocytes Relative 4 %   Monocytes Absolute 0.2 0.1 - 1.0 K/uL   Eosinophils Relative 0 %   Eosinophils Absolute 0.0 0.0 - 0.5 K/uL   Basophils Relative 0 %   Basophils Absolute 0.0 0.0 - 0.1 K/uL   Immature Granulocytes 2 %   Abs Immature Granulocytes 0.13 (H) 0.00 - 0.07 K/uL  Results for orders placed or performed during the hospital encounter of 08/06/19 (from the past 168 hour(s))  Respiratory Panel by RT PCR (Flu A&B, Covid) - Nasopharyngeal Swab   Collection Time: 08/06/19  9:46 PM   Specimen: Nasopharyngeal Swab  Result Value Ref Range   SARS Coronavirus 2 by RT PCR POSITIVE (A) NEGATIVE   Influenza A by PCR NEGATIVE NEGATIVE   Influenza B by PCR NEGATIVE NEGATIVE  Urinalysis, Routine w reflex microscopic   Collection Time: 08/06/19  9:47 PM  Result Value Ref Range   Color, Urine YELLOW YELLOW   APPearance CLOUDY (A) CLEAR   Specific Gravity, Urine 1.024 1.005 - 1.030   pH 6.0 5.0 - 8.0   Glucose, UA NEGATIVE NEGATIVE mg/dL   Hgb urine dipstick NEGATIVE NEGATIVE   Bilirubin Urine NEGATIVE NEGATIVE   Ketones, ur NEGATIVE NEGATIVE mg/dL   Protein, ur 100 (A) NEGATIVE mg/dL   Nitrite NEGATIVE NEGATIVE   Leukocytes,Ua NEGATIVE NEGATIVE   RBC / HPF 6-10 0 - 5 RBC/hpf   WBC, UA 6-10 0 - 5  WBC/hpf   Bacteria, UA FEW (A) NONE SEEN   Squamous Epithelial / LPF 21-50 0 - 5   Mucus PRESENT    Budding Yeast PRESENT    Ca Oxalate Crys, UA PRESENT     Imaging Studies: DG Chest 1 View  Result Date: 08/09/2019 CLINICAL DATA:  Tachypnea. COVID-19 positive. EXAM: CHEST  1 VIEW COMPARISON:  11/17/2010 FINDINGS: There is scattered bilateral airspace opacities. There is no pneumothorax. No large pleural effusion. The heart size is normal. The lung volumes are somewhat low. There is no acute osseous abnormality. IMPRESSION: Multifocal airspace opacities concerning for multifocal pneumonia (viral or bacterial). Electronically Signed   By: Constance Holster M.D.   On: 08/09/2019 17:03   [place] MFM OB FOLLOW UP  Result Date: 07/14/2019 ----------------------------------------------------------------------  OBSTETRICS REPORT                       (Signed Final 07/14/2019 03:38 pm) ---------------------------------------------------------------------- Patient Info  ID #:       [id number]                          D.O.B.:  07-15-1995 (23 yrs)  Name:       Michelle Gallagher              Visit Date: 07/14/2019 02:33 pm ---------------------------------------------------------------------- Performed By  Performed By:     Novella Rob        Ref. Address:     [address] [place]                                                             [place], [place]  Attending:        Johnell Comings MD         [facility]  Referred By:      Willene Hatchet ---------------------------------------------------------------------- Orders   #  Description                          Code         Ordered By   1  [place] MFM OB FOLLOW UP                  67672.09     Peterson Ao  ----------------------------------------------------------------------   #  Order #                    Accession #                 Episode #   1  [id number]                   [id number]                  [id number]  ---------------------------------------------------------------------- Indications   Polyhydramnios, third trimester, antepartum    O40.3XX0   condition or complication, unspecified fetus   Obesity complicating pregnancy, third          O99.213   trimester   Abnormal biochemical screen (AFP positive)     O28.9   Encounter for other antenatal screening        Z36.2   follow-up (low risk NIPS, 3.3FF)   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 Cord Insertion:      Previously Visualized  Amniotic Fluid  AFI FV:      Within normal limits  AFI Sum(cm)     %Tile       Largest Pocket(cm)  28.96           > 97        8.75  RUQ(cm)       RLQ(cm)       LUQ(cm)        LLQ(cm)  7.87          5.96          8.75           6.38  Comment:    Good fetal moevment, tone, and breathing ---------------------------------------------------------------------- Biometry  BPD:      83.7  mm     G. Age:  33w 5d         38  %    CI:        73.38   %    70 - 86  FL/HC:      20.9   %    19.4 - 21.8  HC:      310.5  mm     G. Age:  34w 5d         31  %    HC/AC:      0.97        0.96 - 1.11  AC:      319.7  mm     G. Age:  35w 6d         94  %    FL/BPD:     77.7   %    71 - 87  FL:         65  mm     G. Age:  33w 4d         28  %    FL/AC:      20.3   %    20 - 24  Est. FW:    2547  gm    5 lb 10 oz      71  % ---------------------------------------------------------------------- OB History  Gravidity:    1         Term:   0        Prem:   0        SAB:   0  TOP:          0       Ectopic:  0        Living: 0  ---------------------------------------------------------------------- Gestational Age  LMP:           35w 0d        Date:  11/11/18                 EDD:   08/18/19  U/S Today:     34w 3d                                        EDD:   08/22/19  Best:          34w 0d     Det. ByLoman Chroman         EDD:   08/25/19                                      (01/03/19) ---------------------------------------------------------------------- Anatomy  Cranium:               Previously seen        Aortic Arch:            Previously seen  Cavum:                 Previously seen        Ductal Arch:            Previously seen  Ventricles:            Appears normal         Diaphragm:              Appears normal  Choroid Plexus:        Previously seen        Stomach:                Appears normal, left  sided  Cerebellum:            Previously seen        Abdomen:                Previously seen  Posterior Fossa:       Previously seen        Abdominal Wall:         Previously seen  Nuchal Fold:           Not applicable (>16    Cord Vessels:           Previously seen                         wks GA)  Face:                  Orbits and profile     Kidneys:                Appear normal                         previously seen  Lips:                  Previously seen        Bladder:                Appears normal  Thoracic:              Previously seen        Spine:                  Previously seen  Heart:                 Appears normal         Upper Extremities:      Previously seen                         (4CH, axis, and                         situs)  RVOT:                  Previously seen        Lower Extremities:      Previously seen  LVOT:                  Previously seen  Other:  Nasal bone previously seen.  Hands & Feet visualized previously.          Technically difficult due to maternal habitus and fetal position.  ---------------------------------------------------------------------- Cervix Uterus Adnexa  Cervix  Not visualized (advanced GA >24wks) ---------------------------------------------------------------------- Comments  This patient was seen for a follow up growth scan due to  abnormal serum analytes noted on her quad screen.  She  denies any problems since her last exam.  She was informed that the fetal growth appears appropriate  for her gestational age.  Polyhydramnios with a total AFI of  over 28 cm was noted today.  The patient reports that she  has screened negative for gestational diabetes.  A follow up exam was scheduled in 4 weeks. ----------------------------------------------------------------------                   Johnell Comings, MD Electronically Signed Final Report   07/14/2019 03:38 pm ----------------------------------------------------------------------  Assessment: Principal Problem:   COVID-19 affecting pregnancy in third trimester Active Problems:   Pneumonia due to COVID-19 virus   Maternal morbid obesity, antepartum (HCC)   Abnormal antenatal AFP screen   Polyhydramnios affecting pregnancy (AFI 28 cm on 07/14/19)   Supervision of high-risk pregnancy  Plan: 1. Acute respiratory disease/pneumonia due to COVID-19. 1. Admit to [facility]  2. Consulted TRH, talked to Dr. Early Osmond, will await recommendations; they will co-manage with [place]. 3. Monitor respiratory status and maintain oxygen saturation > 95% with supplemental oxygen as needed. Placed on 2L of oxygen via [place] given her elevated respiratory rate and increased work of breathing; she was maintaining good oxygen saturations on room air but was having some difficulty. 4. Airborne and Droplet Precautions 5. Continuous pulse oximetry 6. Check and repeat labs labs daily: CBC, CMP, CRP, Ddimer, Ferritin. Repeat CXR as indicated. 7. Lovenox [place] daily for VTE prophylaxis 8. Keep patient in lateral decubitus (to relieve uterine pressure  on major vessels) if the patient is not intubated.  Prone patient positioning recommended for patients with high oxygen needs or mechanical ventilation.  Incentive spirometry recommended. 9. No intensive care management would be withheld because of her pregnancy status. Most drugs are safe in pregnancy and the ICU team may discuss with our team. 2. 37 week term gestation 1. Consulted Maternal Fetal Medicine, talked to Dr. Jonette Eva, awaiting formal note/recommendations but he agrees with our plan as outlined below. 2. Consulted Neonatology, talked to Dr. Jerlyn Ly 3. Consulted Anesthesiology, talked to Dr. Aida Raider 4. Ensure [facility] multidisciplinary team is aware of patient; AC Leotis Shames) informed 5. Category I FHR tracing currently. Continue NST twice daily for fetal monitoring.  Ultrasounds to be ordered if needed only after discussing with MFM.  Tocometry as needed.  6. The goal is to prolong pregnancy without maternal compromise so that the patient completely recovers and delivers at a much later gestational age. Vaginal delivery is the preferred route of delivery and is associated with less morbidity; unless there is a contraindication to vaginal delivery or labor. Vaginal delivery is not contraindicated if the patient goes into spontaneous active labor provided there is no maternal or fetal compromise. If emergent delivery is needed for fetal or maternal indications, will perform emergent cesarean delivery.  1. Pregnancy can increase the risk of maternal hypoxemia from the physiological changes. Mechanical ventilation alone is not an indication for delivery. If at any stage it is deemed by TRH/ICU physicians that maternal respiratory or cardiovascular status is likely to improve by uterine decompression (delivery), cesarean section would be considered.  2. If cardiac arrest occurs, emergency cesarean delivery would be performed.  3. Discussed the routine risks of cesarean delivery with  the patient including but not limited to: bleeding which may require transfusion or reoperation; infection which may require antibiotics; injury to bowel, bladder, ureters or other surrounding organs; injury to the fetus; need for additional procedures including hysterectomy in the event of a life-threatening hemorrhage; placental abnormalities wth subsequent pregnancies, incisional problems, thromboembolic phenomenon, death and other postoperative/anesthesia complications. All questions answered. Informed written consent was obtained, the signed consent form was placed in the patient's physical chart. 7. Other routine antenatal care orders placed.  Patient knows that visitation is limited given her COVID positive status.     Verita Schneiders, MD, [facility] [employer], [place]

## 2019-08-09 NOTE — ED Provider Notes (Addendum)
24 year old G1P0 approximately [redacted]w[redacted]d followed by [facility] presents for evaluation of emesis.  Tested positive for Covid 1 week.  1 episode of hemoptysis with 2 subsequent episodes of emesis without hemoptysis. No CP, SOB. Continued cough since covid diagnosis. States she is feeling baby move.  She had initially not been having any abdominal pain however since being roomed she has had approximately 2 minutes of lower abdominal cramping.  Denies any pelvic pain, pelvic pressure, bleeding or vaginal leakage.  The lateral leg swelling, redness or warmth.  Denies any chest pain or shortness of breath.  Has not been taking anything for her symptoms.  HPI: Heart: Denies CP, palpitations Pulm: No cough Abd: cramping, nausea, emesis GU: Denies pelvic pain, pressure, vaginal discharge, bleeding Urine: No dysuria. MSK: No unilateral leg swelling Skin: No erythema, warmth   PE: Gen- Appear well, no distress, Heart- Clear lungs, HR 90-100, 2+ DP, PT pulses bilaterally Lungs-Speaks in full sentences without difficulty.  Lungs clear.  No respiratory distress Abdomen-soft, gravid above umbilicus.  No overlying skin changes fetal heart tones 145 Pelvic- deferred MSK-moves all 4 extremities without difficulty.  No unilateral leg swelling, Homans' sign negative.  Compartments soft Skin-no edema, erythema or warmth Neuro-ambulatory but difficulty  Patient appears overall well.  Reassuring fetal heart tones.  No evidence of eminent delivery however pelvic deferred as patient preference for MAU provider. No CP, SOB for UR complains on initial evaluation. Low suspicion for ACS, PE Vital signs stable, no vaginal bleeding, pelvic pain or discharge.   Consult with Denny Peon, MAU provider who agrees to accept patient in transfer.   MSE was initiated and I personally evaluated the patient and placed orders (if any) at  10:18 PM on Aug 09, 2019.  The patient appears stable so that the remainder of the MSE  may be completed by another provider.   Henderly, Britni A, PA-C 08/09/19 1424    Henderly, Britni A, PA-C 08/09/19 2218    Sabas Sous, MD 08/10/19 1030

## 2019-08-09 NOTE — ED Triage Notes (Signed)
Pt arrives via [facility] from home for vomiting x3 today, tested + for covid 1 week ago. Pt is also [redacted] weeks pregnant, no OB complaints. EMS VSS.

## 2019-08-09 NOTE — Consult Note (Signed)
MFM consultation Telemedicine   Date of service: 08/09/19 Requesting provider: Dr. Verita Schneiders Reason for request: Timing of delivery for Covid Pneumonia  I was contacted by Dr. Harolyn Rutherford regarding timing of delivery for Michelle Gallagher given known polyhydramnios and new onset tachypnea with Covid pneumonia.   Michelle Pianka is a G1P0 at [place] with recent diagnosis of Covid on 5/1 who now developed shortness of breath, worsening cough and abdomina pain. She notes that she is having a some mild difficulty in speaking due to shortness of breath. She denies fever but has some mild chills.  Her pregnancy is complicated by mild polyhydramnios, abnormal AFP,and morbid obesity.  Today's Vitals   08/09/19 1725 08/09/19 1850 08/09/19 1853 08/09/19 1910  BP: 115/72  (!) 105/57   Pulse: 99  (!) 114   Resp: (!) 33  (!) 38   Temp: 98.4 F (36.9 C)  98.3 F (36.8 C)   TempSrc:   Oral   SpO2: 99%  100%   Weight:    131.5 kg  Height:    _0  (1.651 m)  PainSc:  0-No pain     Body mass index is 48.26 kg/m.  No current facility-administered medications on file prior to encounter.   Current Outpatient Medications on File Prior to Encounter  Medication Sig Dispense Refill  . acetaminophen (TYLENOL) 500 MG tablet Take 500 mg by mouth every 6 (six) hours as needed.    . Blood Pressure Monitoring (BLOOD PRESSURE MONITOR AUTOMAT) DEVI 1 Device by Does not apply route daily. 1 kit 0  . ferrous sulfate 325 (65 FE) MG EC tablet Take 1 tablet (325 mg total) by mouth daily with breakfast. (Patient not taking: Reported on 07/01/2019) 45 tablet 3  . Misc. Devices (GOJJI WEIGHT SCALE) MISC 1 Device by Does not apply route daily as needed. To weight self daily as needed at home. ICD-10 code: O09.90 1 each 0  . Prenatal Vit-Fe Fumarate-FA (PREPLUS) 27-1 MG TABS Take 1 tablet by mouth daily. 60 tablet 6  . [DISCONTINUED] promethazine (PHENERGAN) 25 MG tablet Take 1 tablet (25 mg total) by mouth every 6 (six) hours as  needed for nausea or vomiting. 30 tablet 2     CMP Latest Ref Rng & Units 08/09/2019 08/09/2019  Glucose 70 - 99 mg/dL 79 83  BUN 6 - 20 mg/dL 5(L) <5(L)  Creatinine 0.44 - 1.00 mg/dL 0.96 0.99  Sodium 135 - 145 mmol/L 136 135  Potassium 3.5 - 5.1 mmol/L 3.9 3.8  Chloride 98 - 111 mmol/L 107 106  CO2 22 - 32 mmol/L 16(L) 16(L)  Calcium 8.9 - 10.3 mg/dL 8.7(L) 8.7(L)  Total Protein 6.5 - 8.1 g/dL 6.5 6.4(L)  Total Bilirubin 0.3 - 1.2 mg/dL 0.8 0.7  Alkaline Phos 38 - 126 U/L 173(H) 173(H)  AST 15 - 41 U/L 21 20  ALT 0 - 44 U/L 10 9   CBC Latest Ref Rng & Units 08/09/2019 08/09/2019 06/10/2019  WBC 4.0 - 10.5 K/uL 6.1 7.1 11.0(H)  Hemoglobin 12.0 - 15.0 g/dL 12.0 11.7(L) 10.5(L)  Hematocrit 36.0 - 46.0 % 37.0 37.8 32.8(L)  Platelets 150 - 400 K/uL 170 162 218   Past Medical History:  Diagnosis Date  . Asthma    Past Surgical History:  Procedure Laterality Date  . NO PAST SURGERIES     Family History  Problem Relation Age of Onset  . Multiple sclerosis Mother    Social History   Socioeconomic History  . Marital status:  Single    Spouse name: Not on file  . Number of children: Not on file  . Years of education: Not on file  . Highest education level: GED or equivalent  Occupational History  . Not on file  Tobacco Use  . Smoking status: Former Smoker    Packs/day: 0.10    Types: Cigarettes    Quit date: 12/07/2018    Years since quitting: 0.6  . Smokeless tobacco: Never Used  Substance and Sexual Activity  . Alcohol use: Not Currently  . Drug use: No  . Sexual activity: Not Currently    Birth control/protection: None  Other Topics Concern  . Not on file  Social History Narrative  . Not on file   Social Determinants of Health   Financial Resource Strain: Medium Risk  . Difficulty of Paying Living Expenses: Somewhat hard  Food Insecurity: Food Insecurity Present  . Worried About Charity fundraiser in the Last Year: Sometimes true  . Ran Out of Food in the Last  Year: Sometimes true  Transportation Needs: Unmet Transportation Needs  . Lack of Transportation (Medical): Yes  . Lack of Transportation (Non-Medical): Yes  Physical Activity: Inactive  . Days of Exercise per Week: 0 days  . Minutes of Exercise per Session: 0 min  Stress: No Stress Concern Present  . Feeling of Stress : Not at all  Social Connections: Moderately Isolated  . Frequency of Communication with Friends and Family: More than three times a week  . Frequency of Social Gatherings with Friends and Family: More than three times a week  . Attends Religious Services: Never  . Active Member of Clubs or Organizations: No  . Attends Archivist Meetings: Never  . Marital Status: Never married  Intimate Partner Violence: Not At Risk  . Fear of Current or Ex-Partner: No  . Emotionally Abused: No  . Physically Abused: No  . Sexually Abused: No   Impression: Covid in Pregnancy  Counseling:  I discussed with Michelle. Aven the general consensus as reviewed by the Society of maternal fetal medicine which is that delivery is recommended for usual obstetric care. Covid pneumonia is not an indication for delivery.  We recommend prioritizing maternal care and stability.  VTE prophylaxis recommended given critical illness.  Mode of delivery based on obstetrical indications and or worsening critical illness   Maintain O2 saturation > 94%.  Continuous fetal monitoring recommended  Intubation should be considered if greater than 15L per minute is required  Maternal care and treatment per ICU critical care team  I reviewed Dr. Arther Abbott additional recommendations and agree with the detailed plan of care.  I spent 60 minutes with >50% in telephonic consultation, review of records and coordination of care.  All questions answered.  Zariah Jost Rolla Etienne, MD.

## 2019-08-09 NOTE — ED Notes (Signed)
Pt reports being due May 20th. Endorses lower abd cramps and pelvis pressure.

## 2019-08-09 NOTE — Consult Note (Signed)
Medical Consultation   Michelle Gallagher  PTW:[id number]  DOB: May 15, 1995  DOA: 08/09/2019  PCP: Patient, No Pcp Per   Requesting physician: Dr. Macon Large  Reason for consultation: Covid pneumonia    History of Present Illness: Michelle Gallagher is an 23 y.o. female currently 37+[redacted] weeks pregnant admitted with cough and shortness of breath.  She tested positive for COVID-19 3 days ago on 08/06/2019 during MAU evaluation however she was asymptomatic at that time and she discharged home in stable condition.  She came to ER today with worsening cough, shortness of breath, nausea, vomiting, diarrhea, lower abdominal pain, decreased appetite, weakness.  She tells me that she has productive cough associated with shortness of breath especially with lying down.  She denies headache, blurry vision, chest pain, fever or chills, wheezing, leg swelling, palpitation, any urinary symptoms such as dysuria, hematuria, lower back pain.  She has not been taking her prenatal vitamins as it makes her nauseated.  Denies vaginal discharge or bleeding.  She takes Tylenol only as needed for headache/body ache.  Upon arrival: Patient tachycardic, heart rate in 104-108, tachypneic, respiratory rate in 30s, blood pressure soft.  Maintaining oxygen saturation in 100% on 2 L of oxygen via nasal cannula.  Chest x-ray shows multifocal pneumonia.  Inflammatory markers ordered.  CBC shows microcytic anemia, UA positive for bacteria and yeast.  [facility] hospitalist consulted for management of COVID-19 pneumonia.  Past Medical History:  Diagnosis Date  . Asthma          Review of Systems:  ROS As per HPI otherwise 10 point review of systems negative.     Past Medical History: Past Medical History:  Diagnosis Date  . Asthma     Past Surgical History: Past Surgical History:  Procedure Laterality Date  . NO PAST SURGERIES       Allergies:   Allergies  Allergen Reactions  . Pollen Extract        Social History:  reports that she quit smoking about 8 months ago. Her smoking use included cigarettes. She smoked 0.10 packs per day. She has never used smokeless tobacco. She reports previous alcohol use. She reports that she does not use drugs.   Family History: Family History  Problem Relation Age of Onset  . Multiple sclerosis Mother       Physical Exam: Vitals:   08/09/19 1655 08/09/19 1700 08/09/19 1704 08/09/19 1725  BP:   139/77 115/72  Pulse:   95 99  Resp:    (!) 33  Temp:    98.4 F (36.9 C)  TempSrc:      SpO2: 100% 100% 99% 99%  Weight:      Height:        Constitutional: Appearance,  Alert and awake, oriented x3, not in any acute distress.  On 2 L of oxygen via nasal cannula Eyes: PERLA, EOMI, irises appear normal, anicteric sclera,  ENMT: external ears and nose appear normal, normal hearing or hard of hearing            Lips appears normal, oropharynx mucosa, tongue, posterior pharynx appear normal  Neck: neck appears normal, no masses, normal ROM, no thyromegaly, no JVD  CVS: S1-S2 clear, no murmur rubs or gallops, no LE edema, normal pedal pulses  Respiratory:  clear to auscultation bilaterally, no wheezing, rales or rhonchi. Respiratory effort normal. No accessory muscle use.  Abdomen: Uterus appropriate for gestational  age. Musculoskeletal: : no cyanosis, clubbing or edema noted bilaterally                       Joint/bones/muscle exam, strength, contractures or atrophy Neuro: Cranial nerves II-XII intact, strength, sensation, reflexes Psych: judgement and insight appear normal, stable mood and affect, mental status Skin: no rashes or lesions or ulcers, no induration or nodules    Data reviewed:  I have personally reviewed following labs and imaging studies Labs:  CBC: Recent Labs  Lab 08/09/19 1250  WBC 7.1  HGB 11.7*  HCT 37.8  MCV 74.7*  PLT 162    Basic Metabolic Panel: Recent Labs  Lab 08/09/19 1250  NA 135  K 3.8  CL 106   CO2 16*  GLUCOSE 83  BUN <5*  CREATININE 0.99  CALCIUM 8.7*   GFR Estimated Creatinine Clearance: 122.1 mL/min (by C-G formula based on SCr of 0.99 mg/dL). Liver Function Tests: Recent Labs  Lab 08/09/19 1250  AST 20  ALT 9  ALKPHOS 173*  BILITOT 0.7  PROT 6.4*  ALBUMIN 2.5*   Recent Labs  Lab 08/09/19 1250  LIPASE 33   No results for input(s): AMMONIA in the last 168 hours. Coagulation profile No results for input(s): INR, PROTIME in the last 168 hours.  Cardiac Enzymes: No results for input(s): CKTOTAL, CKMB, CKMBINDEX, TROPONINI in the last 168 hours. BNP: Invalid input(s): POCBNP CBG: No results for input(s): GLUCAP in the last 168 hours. D-Dimer No results for input(s): DDIMER in the last 72 hours. Hgb A1c No results for input(s): HGBA1C in the last 72 hours. Lipid Profile No results for input(s): CHOL, HDL, LDLCALC, TRIG, CHOLHDL, LDLDIRECT in the last 72 hours. Thyroid function studies No results for input(s): TSH, T4TOTAL, T3FREE, THYROIDAB in the last 72 hours.  Invalid input(s): FREET3 Anemia work up No results for input(s): VITAMINB12, FOLATE, FERRITIN, TIBC, IRON, RETICCTPCT in the last 72 hours. Urinalysis    Component Value Date/Time   COLORURINE AMBER (A) 08/09/2019 1530   APPEARANCEUR CLOUDY (A) 08/09/2019 1530   LABSPEC 1.028 08/09/2019 1530   PHURINE 5.0 08/09/2019 1530   GLUCOSEU NEGATIVE 08/09/2019 1530   HGBUR NEGATIVE 08/09/2019 1530   BILIRUBINUR NEGATIVE 08/09/2019 1530   KETONESUR 80 (A) 08/09/2019 1530   PROTEINUR 100 (A) 08/09/2019 1530   NITRITE NEGATIVE 08/09/2019 1530   LEUKOCYTESUR NEGATIVE 08/09/2019 1530     Microbiology Recent Results (from the past 240 hour(s))  Respiratory Panel by RT PCR (Flu A&B, Covid) - Nasopharyngeal Swab     Status: Abnormal   Collection Time: 08/06/19  9:46 PM   Specimen: Nasopharyngeal Swab  Result Value Ref Range Status   SARS Coronavirus 2 by RT PCR POSITIVE (A) NEGATIVE Final     Comment: RESULT CALLED TO, READ BACK BY AND VERIFIED WITH: RN G BELLAMY @0017  08/07/19 BY S GEZAHEGN (NOTE) SARS-CoV-2 target nucleic acids are DETECTED. SARS-CoV-2 RNA is generally detectable in upper respiratory specimens  during the acute phase of infection. Positive results are indicative of the presence of the identified virus, but do not rule out bacterial infection or co-infection with other pathogens not detected by the test. Clinical correlation with patient history and other diagnostic information is necessary to determine patient infection status. The expected result is Negative. Fact Sheet for Patients:  10/07/19 Fact Sheet for Healthcare Providers: https://www.moore.com/ This test is not yet approved or cleared by the https://www.young.biz/ FDA and  has been authorized for detection and/or diagnosis  of SARS-CoV-2 by FDA under an Emergency Use Authorization (EUA).  This EUA will remain in effect (meaning this test can be use d) for the duration of  the COVID-19 declaration under Section 564(b)(1) of the Act, 21 U.S.C. section 360bbb-3(b)(1), unless the authorization is terminated or revoked sooner.    Influenza A by PCR NEGATIVE NEGATIVE Final   Influenza B by PCR NEGATIVE NEGATIVE Final    Comment: (NOTE) The Xpert Xpress SARS-CoV-2/FLU/RSV assay is intended as an aid in  the diagnosis of influenza from Nasopharyngeal swab specimens and  should not be used as a sole basis for treatment. Nasal washings and  aspirates are unacceptable for Xpert Xpress SARS-CoV-2/FLU/RSV  testing. Fact Sheet for Patients: https://www.moore.com/ Fact Sheet for Healthcare Providers: https://www.young.biz/ This test is not yet approved or cleared by the [place] FDA and  has been authorized for detection and/or diagnosis of SARS-CoV-2 by  FDA under an Emergency Use Authorization (EUA). This EUA will remain    in effect (meaning this test can be used) for the duration of the  Covid-19 declaration under Section 564(b)(1) of the Act, 21  U.S.C. section 360bbb-3(b)(1), unless the authorization is  terminated or revoked. Performed at [facility], [address] [place], [place] [address]        Inpatient Medications:   Scheduled Meds: . vitamin C  500 mg Oral Daily  . dexamethasone (DECADRON) injection  6 mg Intravenous Q24H  . [START ON 08/10/2019] docusate sodium  100 mg Oral Daily  . enoxaparin (LOVENOX) injection  40 mg Subcutaneous Q24H  . fluconazole  150 mg Oral Once  . ipratropium-albuterol  3 mL Inhalation Q6H  . [START ON 08/10/2019] prenatal multivitamin  1 tablet Oral Q1200  . sodium chloride flush  3 mL Intravenous Once  . zinc sulfate  220 mg Oral Daily   Continuous Infusions: . lactated ringers       Radiological Exams on Admission: DG Chest 1 View  Result Date: 08/09/2019 CLINICAL DATA:  Tachypnea. COVID-19 positive. EXAM: CHEST  1 VIEW COMPARISON:  11/17/2010 FINDINGS: There is scattered bilateral airspace opacities. There is no pneumothorax. No large pleural effusion. The heart size is normal. The lung volumes are somewhat low. There is no acute osseous abnormality. IMPRESSION: Multifocal airspace opacities concerning for multifocal pneumonia (viral or bacterial). Electronically Signed   By: Katherine Mantle M.D.   On: 08/09/2019 17:03    Impression/Recommendations Principal Problem:   COVID-19 affecting pregnancy in third trimester Active Problems:   Supervision of high-risk pregnancy   Maternal morbid obesity, antepartum (HCC)   Abnormal antenatal AFP screen   Polyhydramnios affecting pregnancy   Pneumonia due to COVID-19 virus   Multifocal pneumonia due to COVID-19 virus: -Patient is tachycardic, tachypneic, blood pressure: Soft.  Afebrile, no leukocytosis.  Chest x-ray shows multifocal pneumonia.  COVID-19 positive on 08/06/2019. -Currently on 2 L of  oxygen-maintaining 100%. -On continuous pulse ox-try to wean off of oxygen as tolerated. -Inflammatory markers are pending. -Start on Decadron 10 mg IV daily. -Discussed with the pharmacy regarding remdesivir-no safety data available in pregnant patients.  I discussed with the patient about it-she is okay to hold off remdesivir for now. -Check lactic acid, repeat inflammatory markers tomorrow AM. -Start on p.o. vitamins.  DuoNebs as needed -Monitor vitals closely.  Strict INO's and daily weight.  Monitor signs for fluid overload.  Dehydration: -Likely secondary to N/V/D - urine ketones positive.  Continue IV fluids.  Asymptomatic bacteriuria:  -UA positive  for bacteria and yeast. -Diflucan 150 mg p.o. once as per OB/GYN recommendation.  Microcytic anemia: H&H: 11.7/37.8.  MCV: 74.7  (at baseline ) -Patient is noncompliant with prenatal vitamins.  -Continue prenatal  Thank you for this consultation.  Our [facility] hospitalist team will follow the patient with you.  Time Spent: 35 minutes  Mckinley Jewel M.D. [facility] 08/09/2019, 5:56 PM

## 2019-08-09 NOTE — Consult Note (Signed)
Neonatology Consult to Antenatal Patient:  I was asked by Dr. Macon Large to see this patient in order to provide antenatal counseling due to maternal admission for Covid + pneumonia.  Ms. Michelle Gallagher was admitted today at 29 5/[redacted] weeks EGA for management of covid pneumonia.  She is currently not having active labor and membranes are intact though fetal movement decreased. Pregnancy also complicated by polyhydramnios and morbid obesity. Abnormal AFP screen; normal fetal scan. No GDM.  She is presently on 2L and is starting Decadron, Zinc, Vitamin C and Diflucan.    I spoke with the patient via phone. We discussed general care when delivery takes place if she is still Covid positive and at risk for transmitting virus to her baby.  I reassured her that the risk of vertical transmission is low and that we have policies and procedures in place to provide routine care by [facility] with supportive discharge planning.  In the event her baby needs more attention, we can care for her baby in the NICU.  Mother desires to breast feed; I applauded her decision and let her know we will support lactation until it is safe for her to do so.  She thanked me for reaching out.  She did not have any questions at this time as she felt very tired. I encouraged her to rest and that I / we would be glad to come back if she has more questions later.  Thank you for asking me to talk to this patient.  Dineen Kid Leary Roca, MD Neonatologist  The total length of face-to-face or floor/unit time for this encounter was 25 minutes. Counseling and/or coordination of care was 15 minutes of the above.  .covid

## 2019-08-09 NOTE — MAU Note (Signed)
Pt sent up from ED, 37wk preg with Nausea and diarrhea.  Has not ate or drank anything today. Denies bleeding or leaking.  +Covid on 5/2. Pt taken directly to rm 126, placed on precautions

## 2019-08-09 NOTE — MAU Note (Signed)
Diarrhea for 2 days(watery- 2-3/day), just started vomiting today.

## 2019-08-10 DIAGNOSIS — J1282 Pneumonia due to coronavirus disease 2019: Secondary | ICD-10-CM

## 2019-08-10 LAB — COMPREHENSIVE METABOLIC PANEL
ALT: 10 U/L (ref 0–44)
AST: 20 U/L (ref 15–41)
Albumin: 2.3 g/dL — ABNORMAL LOW (ref 3.5–5.0)
Alkaline Phosphatase: 168 U/L — ABNORMAL HIGH (ref 38–126)
Anion gap: 14 (ref 5–15)
BUN: 7 mg/dL (ref 6–20)
CO2: 15 mmol/L — ABNORMAL LOW (ref 22–32)
Calcium: 8.9 mg/dL (ref 8.9–10.3)
Chloride: 106 mmol/L (ref 98–111)
Creatinine, Ser: 0.99 mg/dL (ref 0.44–1.00)
GFR calc Af Amer: >60 mL/min (ref >60)
GFR calc non Af Amer: >60 mL/min (ref >60)
Glucose, Bld: 88 mg/dL (ref 70–99)
Potassium: 4.4 mmol/L (ref 3.5–5.1)
Sodium: 135 mmol/L (ref 135–145)
Total Bilirubin: 0.8 mg/dL (ref 0.3–1.2)
Total Protein: 6.1 g/dL — ABNORMAL LOW (ref 6.5–8.1)

## 2019-08-10 LAB — CBC WITH DIFFERENTIAL/PLATELET
Abs Immature Granulocytes: 0.1 10*3/uL — ABNORMAL HIGH (ref 0.00–0.07)
Basophils Absolute: 0 10*3/uL (ref 0.0–0.1)
Basophils Relative: 0 %
Eosinophils Absolute: 0 10*3/uL (ref 0.0–0.5)
Eosinophils Relative: 0 %
HCT: 34 % — ABNORMAL LOW (ref 36.0–46.0)
Hemoglobin: 10.6 g/dL — ABNORMAL LOW (ref 12.0–15.0)
Lymphocytes Relative: 5 %
Lymphs Abs: 0.3 10*3/uL — ABNORMAL LOW (ref 0.7–4.0)
MCH: 23.3 pg — ABNORMAL LOW (ref 26.0–34.0)
MCHC: 31.2 g/dL (ref 30.0–36.0)
MCV: 74.9 fL — ABNORMAL LOW (ref 80.0–100.0)
Monocytes Absolute: 0.1 10*3/uL (ref 0.1–1.0)
Monocytes Relative: 2 %
Myelocytes: 1 %
Neutro Abs: 6.1 10*3/uL (ref 1.7–7.7)
Neutrophils Relative %: 92 %
Platelets: 163 10*3/uL (ref 150–400)
RBC: 4.54 MIL/uL (ref 3.87–5.11)
RDW: 18.6 % — ABNORMAL HIGH (ref 11.5–15.5)
WBC: 6.6 10*3/uL (ref 4.0–10.5)
nRBC: 0 /100 WBC
nRBC: 0.3 % — ABNORMAL HIGH (ref 0.0–0.2)

## 2019-08-10 LAB — MAGNESIUM: Magnesium: 2 mg/dL (ref 1.7–2.4)

## 2019-08-10 LAB — C-REACTIVE PROTEIN: CRP: 5.9 mg/dL — ABNORMAL HIGH (ref <1.0)

## 2019-08-10 LAB — LACTIC ACID, PLASMA: Lactic Acid, Venous: 1 mmol/L (ref 0.5–1.9)

## 2019-08-10 LAB — PHOSPHORUS: Phosphorus: 4.8 mg/dL — ABNORMAL HIGH (ref 2.5–4.6)

## 2019-08-10 LAB — FERRITIN: Ferritin: 32 ng/mL (ref 11–307)

## 2019-08-10 LAB — D-DIMER, QUANTITATIVE: D-Dimer, Quant: 2.08 ug/mL-FEU — ABNORMAL HIGH (ref 0.00–0.50)

## 2019-08-10 MED ORDER — SODIUM CHLORIDE 0.9 % IV SOLN
100.0000 mg | Freq: Every day | INTRAVENOUS | Status: AC
  Administered 2019-08-11 – 2019-08-14 (×4): 100 mg via INTRAVENOUS
  Filled 2019-08-10 (×5): qty 20

## 2019-08-10 MED ORDER — SODIUM CHLORIDE 0.9 % IV SOLN
100.0000 mg | Freq: Once | INTRAVENOUS | Status: AC
  Administered 2019-08-10: 100 mg via INTRAVENOUS
  Filled 2019-08-10: qty 20

## 2019-08-10 NOTE — Progress Notes (Signed)
FACULTY PRACTICE ANTEPARTUM COMPREHENSIVE PROGRESS NOTE  Michelle Gallagher is a 24 y.o. G1P0000 at [redacted]w[redacted]d who is admitted for COVID-19 pneumonia.  Estimated Date of Delivery: 08/25/19 Fetal presentation is cephalic as verified on bedside ultrasound done 08/10/19.  Length of Stay:  1 Days.  Admitted 08/09/2019  Subjective: No acute events overnight.  Feeling better compared to yesterday.  Still on oxygen via nasal cannula.  Patient reports good fetal movement.  She reports no uterine contractions, no bleeding and no loss of fluid per vagina.  Vitals:  Blood pressure 115/62, pulse 83, temperature 98.6 F (37 C), temperature source Oral, resp. rate 20, height 5\' 5"  (1.651 m), weight 131.5 kg, last menstrual period 11/11/2018, SpO2 98 %. Physical Examination: CONSTITUTIONAL: Well-developed, well-nourished female in no acute distress.  HENT:  Normocephalic, atraumatic, External right and left ear normal. Has mask on, but nasal cannula noted to be in place EYES: Conjunctivae and EOM are normal. Pupils are equal, round, and reactive to light. No scleral icterus.  NECK: Normal range of motion, supple, no masses SKIN: Skin is warm and dry. No rash noted. Not diaphoretic. No erythema. No pallor. NEUROLGIC: Alert and oriented to person, place, and time. Normal reflexes, muscle tone coordination. No cranial nerve deficit noted. PSYCHIATRIC: Normal mood and affect. Normal behavior. Normal judgment and thought content. CARDIOVASCULAR: Normal heart rate noted, regular rhythm RESPIRATORY: Effort and breath sounds normal, no difficulties with respiration noted MUSCULOSKELETAL: Normal range of motion. No edema and no tenderness. 2+ distal pulses. ABDOMEN: Soft, nontender, nondistended, gravid. CERVIX:  Deferred  Fetal monitoring: FHR: 150 bpm, Variability: moderate, Accelerations: Present, Decelerations: Absent >>Reactive NST x 2  Uterine activity: Rare contractions Bedside Limited Ultrasound: Cephalic  Presentation  Labs: Results for orders placed or performed during the hospital encounter of 08/09/19 (from the past 48 hour(s))  Lipase, blood     Status: None   Collection Time: 08/09/19 12:50 PM  Result Value Ref Range   Lipase 33 11 - 51 U/L    Comment: Performed at [facility], [address], [place], [place]  Comprehensive metabolic panel     Status: Abnormal   Collection Time: 08/09/19 12:50 PM  Result Value Ref Range   Sodium 135 135 - 145 mmol/L   Potassium 3.8 3.5 - 5.1 mmol/L   Chloride 106 98 - 111 mmol/L   CO2 16 (L) 22 - 32 mmol/L   Glucose, Bld 83 70 - 99 mg/dL    Comment: Glucose reference range applies only to samples taken after fasting for at least 8 hours.   BUN <5 (L) 6 - 20 mg/dL   Creatinine, Ser 10/09/19 0.44 - 1.00 mg/dL   Calcium 8.7 (L) 8.9 - 10.3 mg/dL   Total Protein 6.4 (L) 6.5 - 8.1 g/dL   Albumin 2.5 (L) 3.5 - 5.0 g/dL   AST 20 15 - 41 U/L   ALT 9 0 - 44 U/L   Alkaline Phosphatase 173 (H) 38 - 126 U/L   Total Bilirubin 0.7 0.3 - 1.2 mg/dL   GFR calc non Af Amer >60 >60 mL/min   GFR calc Af Amer >60 >60 mL/min   Anion gap 13 5 - 15    Comment: Performed at [facility], [address], [place], [place]  CBC     Status: Abnormal   Collection Time: 08/09/19 12:50 PM  Result Value Ref Range   WBC 7.1 4.0 - 10.5 K/uL   RBC 5.06 3.87 -  5.11 MIL/uL   Hemoglobin 11.7 (L) 12.0 - 15.0 g/dL   HCT 40.9 81.1 - 91.4 %   MCV 74.7 (L) 80.0 - 100.0 fL   MCH 23.1 (L) 26.0 - 34.0 pg   MCHC 31.0 30.0 - 36.0 g/dL   RDW 78.2 (H) 95.6 - 21.3 %   Platelets 162 150 - 400 K/uL   nRBC 0.0 0.0 - 0.2 %    Comment: Performed at [facility], [address], [place], [place] [address]  I-Stat beta hCG blood, ED     Status: Abnormal   Collection Time: 08/09/19 12:58 PM  Result Value Ref Range   I-stat hCG, quantitative >2,000.0 (H) <5 mIU/mL   Comment 3            Comment:   GEST. AGE      CONC.  (mIU/mL)   <=1 WEEK        5 - 50      2 WEEKS       50 - 500     3 WEEKS       100 - 10,000     4 WEEKS     1,000 - 30,000        FEMALE AND NON-PREGNANT FEMALE:     LESS THAN 5 mIU/mL   Urinalysis, Routine w reflex microscopic     Status: Abnormal   Collection Time: 08/09/19  3:30 PM  Result Value Ref Range   Color, Urine AMBER (A) YELLOW    Comment: BIOCHEMICALS MAY BE AFFECTED BY COLOR   APPearance CLOUDY (A) CLEAR   Specific Gravity, Urine 1.028 1.005 - 1.030   pH 5.0 5.0 - 8.0   Glucose, UA NEGATIVE NEGATIVE mg/dL   Hgb urine dipstick NEGATIVE NEGATIVE   Bilirubin Urine NEGATIVE NEGATIVE   Ketones, ur 80 (A) NEGATIVE mg/dL   Protein, ur 846 (A) NEGATIVE mg/dL   Nitrite NEGATIVE NEGATIVE   Leukocytes,Ua NEGATIVE NEGATIVE   WBC, UA 11-20 0 - 5 WBC/hpf   Bacteria, UA MANY (A) NONE SEEN   Squamous Epithelial / LPF 21-50 0 - 5   Mucus PRESENT    Budding Yeast PRESENT     Comment: Performed at [facility], [address], [place], [place] [address]  Brain natriuretic peptide     Status: None   Collection Time: 08/09/19  6:30 PM  Result Value Ref Range   B Natriuretic Peptide 41.7 0.0 - 100.0 pg/mL    Comment: Performed at [facility], [address], [place], [place] [address]  C-reactive protein     Status: Abnormal   Collection Time: 08/09/19  6:30 PM  Result Value Ref Range   CRP 6.3 (H) <1.0 mg/dL    Comment: Performed at [facility], [address], [place], [place] [address]  Comprehensive metabolic panel     Status: Abnormal   Collection Time: 08/09/19  6:30 PM  Result Value Ref Range   Sodium 136 135 - 145 mmol/L   Potassium 3.9 3.5 - 5.1 mmol/L   Chloride 107 98 - 111 mmol/L   CO2 16 (L) 22 - 32 mmol/L   Glucose, Bld 79 70 - 99 mg/dL    Comment: Glucose reference range applies only to samples taken after fasting for at least 8 hours.   BUN 5 (L) 6 - 20 mg/dL   Creatinine, Ser 0.27 0.44 - 1.00 mg/dL   Calcium 8.7 (L) 8.9 - 10.3 mg/dL  Total Protein 6.5 6.5 - 8.1 g/dL    Albumin 2.5 (L) 3.5 - 5.0 g/dL   AST 21 15 - 41 U/L   ALT 10 0 - 44 U/L   Alkaline Phosphatase 173 (H) 38 - 126 U/L   Total Bilirubin 0.8 0.3 - 1.2 mg/dL   GFR calc non Af Amer >60 >60 mL/min   GFR calc Af Amer >60 >60 mL/min   Anion gap 13 5 - 15    Comment: Performed at [facility], [address] [place], [place] [address]  CBC with Differential/Platelet     Status: Abnormal   Collection Time: 08/09/19  6:30 PM  Result Value Ref Range   WBC 6.1 4.0 - 10.5 K/uL   RBC 5.01 3.87 - 5.11 MIL/uL   Hemoglobin 12.0 12.0 - 15.0 g/dL   HCT 19.137.0 47.836.0 - 29.546.0 %   MCV 73.9 (L) 80.0 - 100.0 fL   MCH 24.0 (L) 26.0 - 34.0 pg   MCHC 32.4 30.0 - 36.0 g/dL   RDW 62.118.4 (H) 30.811.5 - 65.715.5 %   Platelets 170 150 - 400 K/uL   nRBC 0.0 0.0 - 0.2 %   Neutrophils Relative % 67 %   Neutro Abs 4.1 1.7 - 7.7 K/uL   Lymphocytes Relative 27 %   Lymphs Abs 1.7 0.7 - 4.0 K/uL   Monocytes Relative 4 %   Monocytes Absolute 0.2 0.1 - 1.0 K/uL   Eosinophils Relative 0 %   Eosinophils Absolute 0.0 0.0 - 0.5 K/uL   Basophils Relative 0 %   Basophils Absolute 0.0 0.0 - 0.1 K/uL   Immature Granulocytes 2 %   Abs Immature Granulocytes 0.13 (H) 0.00 - 0.07 K/uL    Comment: Performed at [facility], [address] [place], [place] [address]  D-dimer, quantitative (not at [facility])     Status: Abnormal   Collection Time: 08/09/19  6:30 PM  Result Value Ref Range   D-Dimer, Quant 2.78 (H) 0.00 - 0.50 ug/mL-FEU    Comment: (NOTE) At the manufacturer cut-off of 0.50 ug/mL FEU, this assay has been documented to exclude PE with a sensitivity and negative predictive value of 97 to 99%.  At this time, this assay has not been approved by the FDA to exclude DVT/VTE. Results should be correlated with clinical presentation. Performed at [facility]  [facility], [address] [place], [place] [address]   Ferritin     Status: None   Collection Time: 08/09/19  6:30 PM  Result Value Ref Range   Ferritin 33 11 - 307  ng/mL    Comment: Performed at [facility], [address] [place], [place] [address]  Lactate dehydrogenase     Status: None   Collection Time: 08/09/19  6:30 PM  Result Value Ref Range   LDH 191 98 - 192 U/L    Comment: Performed at [facility], [address] [place], [place] [address]  Procalcitonin     Status: None   Collection Time: 08/09/19  6:30 PM  Result Value Ref Range   Procalcitonin 0.15 ng/mL    Comment:        Interpretation: PCT (Procalcitonin) <= 0.5 ng/mL: Systemic infection (sepsis) is not likely. Local bacterial infection is possible. (NOTE)       Sepsis PCT Algorithm           Lower Respiratory Tract  Infection PCT Algorithm    ----------------------------     ----------------------------         PCT < 0.25 ng/mL                PCT < 0.10 ng/mL         Strongly encourage             Strongly discourage   discontinuation of antibiotics    initiation of antibiotics    ----------------------------     -----------------------------       PCT 0.25 - 0.50 ng/mL            PCT 0.10 - 0.25 ng/mL               OR       >80% decrease in PCT            Discourage initiation of                                            antibiotics      Encourage discontinuation           of antibiotics    ----------------------------     -----------------------------         PCT >= 0.50 ng/mL              PCT 0.26 - 0.50 ng/mL               AND        <80% decrease in PCT             Encourage initiation of                                             antibiotics       Encourage continuation           of antibiotics    ----------------------------     -----------------------------        PCT >= 0.50 ng/mL                  PCT > 0.50 ng/mL               AND         increase in PCT                  Strongly encourage                                      initiation of antibiotics    Strongly encourage escalation           of  antibiotics                                     -----------------------------                                           PCT <= 0.25 ng/mL  OR                                        > 80% decrease in PCT                                     Discontinue / Do not initiate                                             antibiotics Performed at [facility], [place] [address], [place], [place] [address]   Type and screen Rea     Status: None   Collection Time: 08/09/19  6:30 PM  Result Value Ref Range   ABO/RH(D) O POS    Antibody Screen NEG    Sample Expiration      08/12/2019,2359 Performed at [facility], [place] [address], [place], [place] [address]   Hepatitis B surface antigen     Status: None   Collection Time: 08/09/19  6:30 PM  Result Value Ref Range   Hepatitis B Surface Ag NON REACTIVE NON REACTIVE    Comment: Performed at [place] [address] [place], [place] [address]  Culture, blood (Routine X 2) w Reflex to ID Panel     Status: None (Preliminary result)   Collection Time: 08/09/19  6:30 PM   Specimen: BLOOD  Result Value Ref Range   Specimen Description BLOOD LEFT ANTECUBITAL    Special Requests      BOTTLES DRAWN AEROBIC AND ANAEROBIC Blood Culture adequate volume   Culture      NO GROWTH < 12 HOURS Performed at [facility], [place] [address], [place], [place] [address]    Report Status PENDING   Culture, blood (Routine X 2) w Reflex to ID Panel     Status: None (Preliminary result)   Collection Time: 08/09/19  6:45 PM   Specimen: BLOOD  Result Value Ref Range   Specimen Description BLOOD LEFT ANTECUBITAL    Special Requests      BOTTLES DRAWN AEROBIC ONLY Blood Culture results may not be optimal due to an excessive volume of blood received in culture bottles   Culture      NO GROWTH < 12 HOURS Performed at [place] [address], [place], [place]  [address]    Report Status PENDING   Lactic acid, plasma     Status: None   Collection Time: 08/09/19  8:19 PM  Result Value Ref Range   Lactic Acid, Venous 1.1 0.5 - 1.9 mmol/L    Comment: Performed at [place] [address] [place], [place] [address]  CBC with Differential/Platelet     Status: Abnormal (Preliminary result)   Collection Time: 08/10/19  5:55 AM  Result Value Ref Range   WBC 6.6 4.0 - 10.5 K/uL   RBC 4.54 3.87 - 5.11 MIL/uL   Hemoglobin 10.6 (L) 12.0 - 15.0 g/dL   HCT 34.0 (L) 36.0 - 46.0 %   MCV 74.9 (L) 80.0 - 100.0 fL   MCH 23.3 (L) 26.0 - 34.0 pg   MCHC 31.2 30.0 - 36.0 g/dL  RDW 18.6 (H) 11.5 - 15.5 %   Platelets 163 150 - 400 K/uL   nRBC 0.3 (H) 0.0 - 0.2 %    Comment: Performed at [facility], [address], [place], [place] [address]   Neutrophils Relative % PENDING %   Neutro Abs PENDING 1.7 - 7.7 K/uL   Band Neutrophils PENDING %   Lymphocytes Relative PENDING %   Lymphs Abs PENDING 0.7 - 4.0 K/uL   Monocytes Relative PENDING %   Monocytes Absolute PENDING 0.1 - 1.0 K/uL   Eosinophils Relative PENDING %   Eosinophils Absolute PENDING 0.0 - 0.5 K/uL   Basophils Relative PENDING %   Basophils Absolute PENDING 0.0 - 0.1 K/uL   WBC Morphology PENDING    RBC Morphology PENDING    Smear Review PENDING    Other PENDING %   nRBC PENDING 0 /100 WBC   Metamyelocytes Relative PENDING %   Myelocytes PENDING %   Promyelocytes Relative PENDING %   Blasts PENDING %   Immature Granulocytes PENDING %   Abs Immature Granulocytes PENDING 0.00 - 0.07 K/uL  Comprehensive metabolic panel     Status: Abnormal   Collection Time: 08/10/19  5:55 AM  Result Value Ref Range   Sodium 135 135 - 145 mmol/L   Potassium 4.4 3.5 - 5.1 mmol/L   Chloride 106 98 - 111 mmol/L   CO2 15 (L) 22 - 32 mmol/L   Glucose, Bld 88 70 - 99 mg/dL    Comment: Glucose reference range applies only to samples taken after fasting for at least 8 hours.   BUN 7 6 - 20 mg/dL    Creatinine, Ser 6.57 0.44 - 1.00 mg/dL   Calcium 8.9 8.9 - 84.6 mg/dL   Total Protein 6.1 (L) 6.5 - 8.1 g/dL   Albumin 2.3 (L) 3.5 - 5.0 g/dL   AST 20 15 - 41 U/L   ALT 10 0 - 44 U/L   Alkaline Phosphatase 168 (H) 38 - 126 U/L   Total Bilirubin 0.8 0.3 - 1.2 mg/dL   GFR calc non Af Amer >60 >60 mL/min   GFR calc Af Amer >60 >60 mL/min   Anion gap 14 5 - 15    Comment: Performed at [facility], [address], [place], [place] [address]  C-reactive protein     Status: Abnormal   Collection Time: 08/10/19  5:55 AM  Result Value Ref Range   CRP 5.9 (H) <1.0 mg/dL    Comment: Performed at [facility], [address], [place], [place] [address]  Ferritin     Status: None   Collection Time: 08/10/19  5:55 AM  Result Value Ref Range   Ferritin 32 11 - 307 ng/mL    Comment: Performed at [facility], [address] [place], [place] [address]  Magnesium     Status: None   Collection Time: 08/10/19  5:55 AM  Result Value Ref Range   Magnesium 2.0 1.7 - 2.4 mg/dL    Comment: Performed at [facility], [address], [place], [place] [address]  Phosphorus     Status: Abnormal   Collection Time: 08/10/19  5:55 AM  Result Value Ref Range   Phosphorus 4.8 (H) 2.5 - 4.6 mg/dL    Comment: Performed at [facility], [address], [place], [place] [address]  Lactic acid, plasma     Status: None   Collection Time: 08/10/19  5:55 AM  Result Value Ref Range   Lactic Acid, Venous 1.0 0.5 - 1.9 mmol/L    Comment: Performed at [facility], [address] [place], [place] [address]   Imaging: DG Chest 1 View  Result Date: 08/09/2019 CLINICAL DATA:  Tachypnea. COVID-19 positive. EXAM: CHEST  1 VIEW COMPARISON:  11/17/2010 FINDINGS: There is scattered bilateral airspace opacities. There is no pneumothorax. No large pleural effusion. The heart size is normal. The lung volumes are somewhat low. There is no acute osseous abnormality. IMPRESSION: Multifocal  airspace opacities concerning for multifocal pneumonia (viral or bacterial). Electronically Signed   By: Katherine Mantle M.D.   On: 08/09/2019 17:03    Current scheduled medications . vitamin C  500 mg Oral Daily  . dexamethasone (DECADRON) injection  6 mg Intravenous Q24H  . docusate sodium  100 mg Oral Daily  . enoxaparin (LOVENOX) injection  70 mg Subcutaneous Q24H  . Ipratropium-Albuterol  1 puff Inhalation QID  . prenatal multivitamin  1 tablet Oral Q1200  . sodium chloride flush  3 mL Intravenous Once  . zinc sulfate  220 mg Oral Daily    I have reviewed the patient's current medications.  ASSESSMENT: Principal Problem:   COVID-19 infection affecting pregnancy in third trimester Active Problems:   Supervision of high-risk pregnancy   Maternal morbid obesity, antepartum (HCC)   Abnormal antenatal AFP screen   Polyhydramnios affecting pregnancy   Pneumonia due to COVID-19 virus   PLAN: Appreciate input from Dr. Jacqulyn Bath [facility]) and ongoing co-management with the [facility] Internal Medicine team.  Also appreciate the consultations and recommendations from Dr. Leary Roca (Neonatology) and Dr. Grace Bushy (MFM). OB Anesthesiology and [facility] Multidisciplinary teams are also aware of this patient's admission. - Continue Dexamethasone IV 6 mg daily and Combivent.  Antitussives also as needed.  - Continue oxygen via 2L [place] as needed to maintain saturations above 95%, will wean as tolerated. - If Remdisivir is needed, there is no contraindication about using this during pregnancy and it has been used multiple occasions. Although the data in obstetrics is limited, there is no reported fetal toxicity with this medication and it should not be withheld because of pregnancy. Will defer decision to start this to [facility] team and further discussion with the patient. - Follow up daily labs and TRH recommendations. - Continue Lovenox for VTE prophylaxis. - UA on admission had positive bacteria, but also had 21-50  epithelial cells concerning for contamination. No blood, no LE, no nitrates.  No urinary symptoms reported by patient.  Urine culture pending. Does not meet criteria for asymptomatic bacteruria as of now, no current treatment is indicated.   - Continue NST twice a day, no current indication for continuous FHR monitoring given Category I FHR tracing. Tocometry as needed. Follow up scan for known polyhydramnios will be done as outpatient as indicated by MFM. - There is no current indication for delivery. If maternal or fetal condition condition deteriorates, delivery will be indicated. Patient consented for cesarean delivery if emergent delivery needed, but vaginal delivery is preferred route of delivery as long as there are no contraindications to this route of delivery.  Hope to keep patient pregnant until after [redacted] weeks gestation if possible, as long as patient and fetus remain stable.  - Continue routine antenatal care.   Jaynie Collins, MD, FACOG Obstetrician & Gynecologist, [facility] for [employer], [facility]

## 2019-08-10 NOTE — Progress Notes (Signed)
PROGRESS NOTE                                                                                                                                                                                                             Patient Demographics:    Michelle Gallagher, is a 24 y.o. female, DOB - 19-May-1995, EXB:[id number]  Outpatient Primary MD for the patient is Patient, No Pcp Per   Admit date - 08/09/2019   LOS - 1  Chief Complaint  Patient presents with  . Emesis       Brief Narrative: Patient is a 24 y.o. female with remote history of bronchial asthma-[redacted] weeks pregnant-presenting with acute hypoxic respiratory failure secondary to COVID-19 pneumonia.  Significant Events: 5/4>> admit for hypoxia secondary to COVID-19  COVID-19 medications: Steroids: 5/4>> Remdesivir: 5/5>>  Antibiotics: None  Microbiology data: 5/4: Blood cultures>> pending  DVT prophylaxis: SQ Lovenox  Procedures: None    Subjective:    Michelle Gallagher today continues to cough-stable on 2 L of oxygen this morning.  Feels essentially the same as yesterday.   Assessment  & Plan :   Acute Hypoxic Resp Failure due to Covid 19 Viral pneumonia: Stable-mild hypoxia this morning-on 2 L of oxygen.  Continues to have cough.  After discussion with OB MD-and then with patient-explained risks/benefits-she is agreeable to start remdesivir.  Hospitalist service will continue to follow closely.  Fever: afebrile/  O2 requirements:  SpO2: 100 % O2 Flow Rate (L/min): 1 L/min   COVID-19 Labs: Recent Labs    08/09/19 1830 08/10/19 0555  DDIMER 2.78* 2.08*  FERRITIN 33 32  LDH 191  --   CRP 6.3* 5.9*       Component Value Date/Time   BNP 41.7 08/09/2019 1830    Recent Labs  Lab 08/09/19 1830  PROCALCITON 0.15    Lab Results  Component Value Date   SARSCOV2NAA POSITIVE (A) 08/06/2019     Prone/Incentive Spirometry: encouraged incentive  spirometry use 3-4/hour.  37 weeks term gestation: Per primary service.  ABG: No results found for: PHART, PCO2ART, PO2ART, HCO3, TCO2, ACIDBASEDEF, O2SAT  Vent Settings: N/A    Condition - Stable  Family Communication  : Patient wants to update family herself.  I have asked her to let me know if they have any Covid related pneumonia questions.  Code Status :  Full Code  Diet :  Diet Order            Diet regular Room service appropriate? Yes; Fluid consistency: Thin  Diet effective now               Disposition Plan  : Per primary service.  Antimicorbials  :    Anti-infectives (From admission, onward)   Start     Dose/Rate Route Frequency Ordered Stop   08/11/19 1000  remdesivir 100 mg in sodium chloride 0.9 % 100 mL IVPB     100 mg 200 mL/hr over 30 Minutes Intravenous Daily 08/10/19 0916 08/15/19 0959   08/10/19 1030  remdesivir 100 mg in sodium chloride 0.9 % 100 mL IVPB     100 mg 200 mL/hr over 30 Minutes Intravenous  Once 08/10/19 0916     08/10/19 1000  remdesivir 100 mg in sodium chloride 0.9 % 100 mL IVPB     100 mg 200 mL/hr over 30 Minutes Intravenous  Once 08/10/19 0916 08/10/19 1109   08/09/19 2200  nitrofurantoin (macrocrystal-monohydrate) (MACROBID) capsule 100 mg  Status:  Discontinued     100 mg Oral Every 12 hours 08/09/19 1750 08/09/19 1753   08/09/19 1800  fluconazole (DIFLUCAN) tablet 150 mg     150 mg Oral  Once 08/09/19 1750 08/09/19 1954      Inpatient Medications  Scheduled Meds: . vitamin C  500 mg Oral Daily  . dexamethasone (DECADRON) injection  6 mg Intravenous Q24H  . docusate sodium  100 mg Oral Daily  . enoxaparin (LOVENOX) injection  70 mg Subcutaneous Q24H  . Ipratropium-Albuterol  1 puff Inhalation QID  . prenatal multivitamin  1 tablet Oral Q1200  . sodium chloride flush  3 mL Intravenous Once  . zinc sulfate  220 mg Oral Daily   Continuous Infusions: . lactated ringers 75 mL/hr at 08/10/19 0454  . remdesivir 100 mg in  NS 100 mL 100 mg (08/10/19 1116)   Followed by  . [START ON 08/11/2019] remdesivir 100 mg in NS 100 mL     PRN Meds:.acetaminophen, calcium carbonate, chlorpheniramine-HYDROcodone, guaiFENesin-dextromethorphan, zolpidem   Time Spent in minutes  25  See all Orders from today for further details   Jeoffrey Massed M.D on 08/10/2019 at 11:24 AM  To page go to [email or web address] - use universal password  [facility] -  Office  [phone]    Objective:   Vitals:   08/10/19 1030 08/10/19 1035 08/10/19 1040 08/10/19 1045  BP: 121/68     Pulse: 91     Resp: (!) 26     Temp: 97.6 F (36.4 C)     TempSrc: Oral     SpO2: 100% 99% 100% 100%  Weight:      Height:        Wt Readings from Last 3 Encounters:  08/09/19 131.5 kg  08/03/19 131.6 kg  07/28/19 133.4 kg     Intake/Output Summary (Last 24 hours) at 08/10/2019 1124 Last data filed at 08/10/2019 2956 Gross per 24 hour  Intake 360 ml  Output 700 ml  Net -340 ml     Physical Exam Gen Exam:Alert awake-not in any distress HEENT:atraumatic, normocephalic Chest: B/L clear to auscultation anteriorly CVS:S1S2 regular Abdomen:soft non tender Extremities:no edema Neurology: Non focal Skin: no rash   Data Review:    CBC Recent Labs  Lab 08/09/19 1250 08/09/19 1830 08/10/19 0555  WBC 7.1 6.1 6.6  HGB 11.7* 12.0 10.6*  HCT 37.8 37.0 34.0*  PLT 162 170 163  MCV 74.7* 73.9* 74.9*  MCH 23.1* 24.0* 23.3*  MCHC 31.0 32.4 31.2  RDW 18.8* 18.4* 18.6*  LYMPHSABS  --  1.7 0.3*  MONOABS  --  0.2 0.1  EOSABS  --  0.0 0.0  BASOSABS  --  0.0 0.0    Chemistries  Recent Labs  Lab 08/09/19 1250 08/09/19 1830 08/10/19 0555  NA 135 136 135  K 3.8 3.9 4.4  CL 106 107 106  CO2 16* 16* 15*  GLUCOSE 83 79 88  BUN <5* 5* 7  CREATININE 0.99 0.96 0.99  CALCIUM 8.7* 8.7* 8.9  MG  --   --  2.0  AST ALT ALKPHOS 173* 173* 168*  BILITOT 0.7 0.8 0.8    ------------------------------------------------------------------------------------------------------------------ No results for input(s): CHOL, HDL, LDLCALC, TRIG, CHOLHDL, LDLDIRECT in the last 72 hours.  Lab Results  Component Value Date   HGBA1C 5.6 02/04/2019   ------------------------------------------------------------------------------------------------------------------ No results for input(s): TSH, T4TOTAL, T3FREE, THYROIDAB in the last 72 hours.  Invalid input(s): FREET3 ------------------------------------------------------------------------------------------------------------------ Recent Labs    08/09/19 1830 08/10/19 0555  FERRITIN 33 32    Coagulation profile No results for input(s): INR, PROTIME in the last 168 hours.  Recent Labs    08/09/19 1830 08/10/19 0555  DDIMER 2.78* 2.08*    Cardiac Enzymes No results for input(s): CKMB, TROPONINI, MYOGLOBIN in the last 168 hours.  Invalid input(s): CK ------------------------------------------------------------------------------------------------------------------    Component Value Date/Time   BNP 41.7 08/09/2019 1830    Micro Results Recent Results (from the past 240 hour(s))  Respiratory Panel by RT PCR (Flu A&B, Covid) - Nasopharyngeal Swab     Status: Abnormal   Collection Time: 08/06/19  9:46 PM   Specimen: Nasopharyngeal Swab  Result Value Ref Range Status   SARS Coronavirus 2 by RT PCR POSITIVE (A) NEGATIVE Final    Comment: RESULT CALLED TO, READ BACK BY AND VERIFIED WITH: RN G BELLAMY  08/07/19 BY S GEZAHEGN (NOTE) SARS-CoV-2 target nucleic acids are DETECTED. SARS-CoV-2 RNA is generally detectable in upper respiratory specimens  during the acute phase of infection. Positive results are indicative of the presence of the identified virus, but do not rule out bacterial infection or co-infection with other pathogens not detected by the test. Clinical correlation with patient history  and other diagnostic information is necessary to determine patient infection status. The expected result is Negative. Fact Sheet for Patients:  https://www.moore.com/ Fact Sheet for Healthcare Providers: https://www.young.biz/ This test is not yet approved or cleared by the [place] FDA and  has been authorized for detection and/or diagnosis of SARS-CoV-2 by FDA under an Emergency Use Authorization (EUA).  This EUA will remain in effect (meaning this test can be use d) for the duration of  the COVID-19 declaration under Section 564(b)(1) of the Act, 21 U.S.C. section 360bbb-3(b)(1), unless the authorization is terminated or revoked sooner.    Influenza A by PCR NEGATIVE NEGATIVE Final   Influenza B by PCR NEGATIVE NEGATIVE Final    Comment: (NOTE) The Xpert Xpress SARS-CoV-2/FLU/RSV assay is intended as an aid in  the diagnosis of influenza from Nasopharyngeal swab specimens and  should not be used as a sole basis for treatment. Nasal washings and  aspirates are unacceptable for Xpert Xpress SARS-CoV-2/FLU/RSV  testing. Fact Sheet for Patients: https://www.moore.com/ Fact Sheet for Healthcare Providers: https://www.young.biz/ This test is not yet approved or cleared by the [place] FDA and  has  been authorized for detection and/or diagnosis of SARS-CoV-2 by  FDA under an Emergency Use Authorization (EUA). This EUA will remain  in effect (meaning this test can be used) for the duration of the  Covid-19 declaration under Section 564(b)(1) of the Act, 21  U.S.C. section 360bbb-3(b)(1), unless the authorization is  terminated or revoked. Performed at [facility], [address] [place], [place] [address]   Culture, blood (Routine X 2) w Reflex to ID Panel     Status: None (Preliminary result)   Collection Time: 08/09/19  6:30 PM   Specimen: BLOOD  Result Value Ref Range Status   Specimen  Description BLOOD LEFT ANTECUBITAL  Final   Special Requests   Final    BOTTLES DRAWN AEROBIC AND ANAEROBIC Blood Culture adequate volume   Culture   Final    NO GROWTH < 12 HOURS Performed at [facility], [address] [place], [place] [address]    Report Status PENDING  Incomplete  Culture, blood (Routine X 2) w Reflex to ID Panel     Status: None (Preliminary result)   Collection Time: 08/09/19  6:45 PM   Specimen: BLOOD  Result Value Ref Range Status   Specimen Description BLOOD LEFT ANTECUBITAL  Final   Special Requests   Final    BOTTLES DRAWN AEROBIC ONLY Blood Culture results may not be optimal due to an excessive volume of blood received in culture bottles   Culture   Final    NO GROWTH < 12 HOURS Performed at [facility], [address] [place], [place] [address]    Report Status PENDING  Incomplete    Radiology Reports DG Chest 1 View  Result Date: 08/09/2019 CLINICAL DATA:  Tachypnea. COVID-19 positive. EXAM: CHEST  1 VIEW COMPARISON:  11/17/2010 FINDINGS: There is scattered bilateral airspace opacities. There is no pneumothorax. No large pleural effusion. The heart size is normal. The lung volumes are somewhat low. There is no acute osseous abnormality. IMPRESSION: Multifocal airspace opacities concerning for multifocal pneumonia (viral or bacterial). Electronically Signed   By: Katherine Mantlehristopher  Green M.D.   On: 08/09/2019 17:03   US MFM OB FOLLOW UP  Result Date: 07/14/2019 ----------------------------------------------------------------------  OBSTETRICS REPORT                       (Signed Final 07/14/2019 03:38 pm) ---------------------------------------------------------------------- Patient Info  ID #:       [id number]                          D.O.B.:  1996-04-05 (23 yrs)  Name:       Rowe ClackMIRACLE D Benninger              Visit Date: 07/14/2019 02:33 pm ---------------------------------------------------------------------- Performed By  Performed By:     Lenise ArenaHannah  Bazemore        Ref. Address:     [address]                    [facility]                                                             [place], [place]  Attending:  Ma Rings MD         [facility]  Referred By:      [facility] ---------------------------------------------------------------------- Orders   #  Description                          Code         Ordered By   1  [place] MFM OB FOLLOW UP                  16109.60     Rosana Hoes  ----------------------------------------------------------------------   #  Order #                    Accession #                 Episode #   1  [id number]                  [id number]                  [id number]  ---------------------------------------------------------------------- Indications   Polyhydramnios, third trimester, antepartum    O40.3XX0   condition or complication, unspecified fetus   Obesity complicating pregnancy, third          O99.213   trimester   Abnormal biochemical screen (AFP positive)     O28.9   Encounter for other antenatal screening        Z36.2   follow-up (low risk NIPS, 3.3FF)   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 Cord Insertion:      Previously Visualized  Amniotic Fluid  AFI FV:      Within normal limits  AFI Sum(cm)     %Tile       Largest Pocket(cm)  28.96           > 97        8.75  RUQ(cm)       RLQ(cm)       LUQ(cm)        LLQ(cm)  7.87          5.96          8.75           6.38  Comment:    Good fetal moevment, tone, and breathing ---------------------------------------------------------------------- Biometry  BPD:       83.7  mm     G. Age:  33w 5d  38  %    CI:        73.38   %    70 - 86                                                          FL/HC:      20.9   %    19.4 - 21.8  HC:      310.5  mm     G. Age:  34w 5d         31  %    HC/AC:      0.97        0.96 - 1.11  AC:      319.7  mm     G. Age:  35w 6d         94  %    FL/BPD:     77.7   %    71 - 87  FL:         65  mm     G. Age:  33w 4d         28  %    FL/AC:      20.3   %    20 - 24  Est. FW:    2547  gm    5 lb 10 oz      71  % ---------------------------------------------------------------------- OB History  Gravidity:    1         Term:   0        Prem:   0        SAB:   0  TOP:          0       Ectopic:  0        Living: 0 ---------------------------------------------------------------------- Gestational Age  LMP:           35w 0d        Date:  11/11/18                 EDD:   08/18/19  U/S Today:     34w 3d                                        EDD:   08/22/19  Best:          34w 0d     Det. ByMarcella Dubs         EDD:   08/25/19                                      (01/03/19) ---------------------------------------------------------------------- Anatomy  Cranium:               Previously seen        Aortic Arch:            Previously seen  Cavum:                 Previously seen        Ductal Arch:            Previously seen  Ventricles:            Appears normal         Diaphragm:              Appears normal  Choroid Plexus:        Previously seen        Stomach:                Appears normal, left                                                                        sided  Cerebellum:            Previously seen        Abdomen:                Previously seen  Posterior Fossa:       Previously seen        Abdominal Wall:         Previously seen  Nuchal Fold:           Not applicable (>20    Cord Vessels:           Previously seen                         wks GA)  Face:                  Orbits and profile     Kidneys:                Appear normal                          previously seen  Lips:                  Previously seen        Bladder:                Appears normal  Thoracic:              Previously seen        Spine:                  Previously seen  Heart:                 Appears normal         Upper Extremities:      Previously seen                         (4CH, axis, and                         situs)  RVOT:                  Previously seen        Lower Extremities:      Previously seen  LVOT:                  Previously seen  Other:  Nasal bone previously seen.  Hands & Feet visualized previously.  Technically difficult due to maternal habitus and fetal position. ---------------------------------------------------------------------- Cervix Uterus Adnexa  Cervix  Not visualized (advanced GA >24wks) ---------------------------------------------------------------------- Comments  This patient was seen for a follow up growth scan due to  abnormal serum analytes noted on her quad screen.  She  denies any problems since her last exam.  She was informed that the fetal growth appears appropriate  for her gestational age.  Polyhydramnios with a total AFI of  over 28 cm was noted today.  The patient reports that she  has screened negative for gestational diabetes.  A follow up exam was scheduled in 4 weeks. ----------------------------------------------------------------------                   Johnell Comings, MD Electronically Signed Final Report   07/14/2019 03:38 pm ----------------------------------------------------------------------

## 2019-08-10 NOTE — Progress Notes (Signed)
Patient ID: Michelle Gallagher, female   DOB: [birth year], 24 y.o.   MRN: [id number] Spoke to [facility] due to pt's Dx and residing in a group home. They will contact pt for additional information and begin contact protocol.

## 2019-08-10 NOTE — Progress Notes (Signed)
Oxygen decreased to 1l/min. Via nasal cannula.

## 2019-08-10 NOTE — Progress Notes (Signed)
O2 turned off.  Patient continues to sat 98-100%.  No worsening of SOB or difficulty breathing.

## 2019-08-11 ENCOUNTER — Telehealth: Payer: Medicaid Other | Admitting: Obstetrics and Gynecology

## 2019-08-11 DIAGNOSIS — Z3A37 37 weeks gestation of pregnancy: Secondary | ICD-10-CM

## 2019-08-11 LAB — CBC WITH DIFFERENTIAL/PLATELET
Abs Immature Granulocytes: 0 10*3/uL (ref 0.00–0.07)
Basophils Absolute: 0 10*3/uL (ref 0.0–0.1)
Basophils Relative: 0 %
Eosinophils Absolute: 0 10*3/uL (ref 0.0–0.5)
Eosinophils Relative: 0 %
HCT: 33.4 % — ABNORMAL LOW (ref 36.0–46.0)
Hemoglobin: 10.7 g/dL — ABNORMAL LOW (ref 12.0–15.0)
Lymphocytes Relative: 4 %
Lymphs Abs: 0.4 10*3/uL — ABNORMAL LOW (ref 0.7–4.0)
MCH: 23.6 pg — ABNORMAL LOW (ref 26.0–34.0)
MCHC: 32 g/dL (ref 30.0–36.0)
MCV: 73.7 fL — ABNORMAL LOW (ref 80.0–100.0)
Monocytes Absolute: 0.2 10*3/uL (ref 0.1–1.0)
Monocytes Relative: 2 %
Neutro Abs: 8.7 10*3/uL — ABNORMAL HIGH (ref 1.7–7.7)
Neutrophils Relative %: 94 %
Platelets: 152 10*3/uL (ref 150–400)
RBC: 4.53 MIL/uL (ref 3.87–5.11)
RDW: 18.7 % — ABNORMAL HIGH (ref 11.5–15.5)
WBC: 9.3 10*3/uL (ref 4.0–10.5)
nRBC: 0.3 % — ABNORMAL HIGH (ref 0.0–0.2)
nRBC: 2 /100 WBC — ABNORMAL HIGH

## 2019-08-11 LAB — COMPREHENSIVE METABOLIC PANEL
ALT: 8 U/L (ref 0–44)
AST: 22 U/L (ref 15–41)
Albumin: 2.1 g/dL — ABNORMAL LOW (ref 3.5–5.0)
Alkaline Phosphatase: 160 U/L — ABNORMAL HIGH (ref 38–126)
Anion gap: 13 (ref 5–15)
BUN: 8 mg/dL (ref 6–20)
CO2: 17 mmol/L — ABNORMAL LOW (ref 22–32)
Calcium: 8.8 mg/dL — ABNORMAL LOW (ref 8.9–10.3)
Chloride: 106 mmol/L (ref 98–111)
Creatinine, Ser: 0.84 mg/dL (ref 0.44–1.00)
GFR calc Af Amer: >60 mL/min (ref >60)
GFR calc non Af Amer: >60 mL/min (ref >60)
Glucose, Bld: 105 mg/dL — ABNORMAL HIGH (ref 70–99)
Potassium: 3.9 mmol/L (ref 3.5–5.1)
Sodium: 136 mmol/L (ref 135–145)
Total Bilirubin: 0.6 mg/dL (ref 0.3–1.2)
Total Protein: 5.9 g/dL — ABNORMAL LOW (ref 6.5–8.1)

## 2019-08-11 LAB — CULTURE, OB URINE

## 2019-08-11 LAB — C-REACTIVE PROTEIN: CRP: 3.8 mg/dL — ABNORMAL HIGH (ref <1.0)

## 2019-08-11 LAB — D-DIMER, QUANTITATIVE: D-Dimer, Quant: 1.07 ug/mL-FEU — ABNORMAL HIGH (ref 0.00–0.50)

## 2019-08-11 LAB — FERRITIN: Ferritin: 40 ng/mL (ref 11–307)

## 2019-08-11 MED ORDER — ONDANSETRON HCL 4 MG/2ML IJ SOLN
4.0000 mg | Freq: Four times a day (QID) | INTRAMUSCULAR | Status: DC | PRN
  Administered 2019-08-11 – 2019-08-13 (×2): 4 mg via INTRAVENOUS
  Filled 2019-08-11 (×2): qty 2

## 2019-08-11 NOTE — Progress Notes (Signed)
Patient ID: Michelle Gallagher, female   DOB: May 01, 1995, 24 y.o.   MRN: [id number] FACULTY PRACTICE ANTEPARTUM COMPREHENSIVE PROGRESS NOTE  Michelle Gallagher is a 24 y.o. G1P0000 at [redacted]w[redacted]d who is admitted for COVID-19 pneumonia.  Estimated Date of Delivery: 08/25/19 Fetal presentation is cephalic as verified on bedside ultrasound done 08/10/19.  Length of Stay:  2 Days.  Admitted 08/09/2019  Subjective: No acute events overnight.  Feeling better compared to yesterday.  Patient on RA overnight. Patient reports improvement in her cough. She continues to experience SOB with ambulation to bathroom. Patient reports good fetal movement.  She reports no uterine contractions, no bleeding and no loss of fluid per vagina.  Vitals:  Blood pressure 129/66, pulse 80, temperature 98.3 F (36.8 C), temperature source Oral, resp. rate (!) 24, height  (1.651 m), weight 131.5 kg, last menstrual period 11/11/2018, SpO2 100 %. Physical Examination: CONSTITUTIONAL: Well-developed, well-nourished female in no acute distress.  HENT:  Normocephalic, atraumatic. NEUROLGIC: Alert and oriented to person, place, and time. Normal reflexes, muscle tone coordination. No cranial nerve deficit noted. CARDIOVASCULAR: Normal heart rate noted, regular rhythm RESPIRATORY: Effort and breath sounds normal, no difficulties with respiration noted MUSCULOSKELETAL: Normal range of motion. No edema and no tenderness. 2+ distal pulses. ABDOMEN: Soft, nontender, gravid. CERVIX:  Deferred  Fetal monitoring: FHR: 140 bpm, Variability: moderate, Accelerations: Present, Decelerations: Absent  Uterine activity: Rare contractions Bedside Limited Ultrasound: Cephalic Presentation  Labs: Results for orders placed or performed during the hospital encounter of 08/09/19 (from the past 48 hour(s))  Lipase, blood     Status: None   Collection Time: 08/09/19 12:50 PM  Result Value Ref Range   Lipase 33 11 - 51 U/L    Comment: Performed at [facility], [address], [place], [place] [address]  Comprehensive metabolic panel     Status: Abnormal   Collection Time: 08/09/19 12:50 PM  Result Value Ref Range   Sodium 135 135 - 145 mmol/L   Potassium 3.8 3.5 - 5.1 mmol/L   Chloride 106 98 - 111 mmol/L   CO2 16 (L) 22 - 32 mmol/L   Glucose, Bld 83 70 - 99 mg/dL    Comment: Glucose reference range applies only to samples taken after fasting for at least 8 hours.   BUN <5 (L) 6 - 20 mg/dL   Creatinine, Ser 1.91 0.44 - 1.00 mg/dL   Calcium 8.7 (L) 8.9 - 10.3 mg/dL   Total Protein 6.4 (L) 6.5 - 8.1 g/dL   Albumin 2.5 (L) 3.5 - 5.0 g/dL   AST 20 15 - 41 U/L   ALT 9 0 - 44 U/L   Alkaline Phosphatase 173 (H) 38 - 126 U/L   Total Bilirubin 0.7 0.3 - 1.2 mg/dL   GFR calc non Af Amer >60 >60 mL/min   GFR calc Af Amer >60 >60 mL/min   Anion gap 13 5 - 15    Comment: Performed at [facility], [address] [place], [place] [address]  CBC     Status: Abnormal   Collection Time: 08/09/19 12:50 PM  Result Value Ref Range   WBC 7.1 4.0 - 10.5 K/uL   RBC 5.06 3.87 - 5.11 MIL/uL   Hemoglobin 11.7 (L) 12.0 - 15.0 g/dL   HCT 56.2 13.0 - 86.5 %   MCV 74.7 (L) 80.0 - 100.0 fL   MCH 23.1 (L) 26.0 - 34.0 pg   MCHC 31.0 30.0 - 36.0 g/dL  RDW 18.8 (H) 11.5 - 15.5 %   Platelets 162 150 - 400 K/uL   nRBC 0.0 0.0 - 0.2 %    Comment: Performed at [facility], [place] [address], [place], [place] [address]  I-Stat beta hCG blood, ED     Status: Abnormal   Collection Time: 08/09/19 12:58 PM  Result Value Ref Range   I-stat hCG, quantitative >2,000.0 (H) <5 mIU/mL   Comment 3            Comment:   GEST. AGE      CONC.  (mIU/mL)   <=1 WEEK        5 - 50     2 WEEKS       50 - 500     3 WEEKS       100 - 10,000     4 WEEKS     1,000 - 30,000        FEMALE AND NON-PREGNANT FEMALE:     LESS THAN 5 mIU/mL   Urinalysis, Routine w reflex microscopic     Status: Abnormal   Collection Time: 08/09/19  3:30 PM  Result Value Ref Range    Color, Urine AMBER (A) YELLOW    Comment: BIOCHEMICALS MAY BE AFFECTED BY COLOR   APPearance CLOUDY (A) CLEAR   Specific Gravity, Urine 1.028 1.005 - 1.030   pH 5.0 5.0 - 8.0   Glucose, UA NEGATIVE NEGATIVE mg/dL   Hgb urine dipstick NEGATIVE NEGATIVE   Bilirubin Urine NEGATIVE NEGATIVE   Ketones, ur 80 (A) NEGATIVE mg/dL   Protein, ur 100 (A) NEGATIVE mg/dL   Nitrite NEGATIVE NEGATIVE   Leukocytes,Ua NEGATIVE NEGATIVE   WBC, UA 11-20 0 - 5 WBC/hpf   Bacteria, UA MANY (A) NONE SEEN   Squamous Epithelial / LPF 21-50 0 - 5   Mucus PRESENT    Budding Yeast PRESENT     Comment: Performed at [facility], [place] [address], [place], [place] [address]  Culture, [place] Urine     Status: Abnormal   Collection Time: 08/09/19  3:30 PM   Specimen: Urine, Random  Result Value Ref Range   Specimen Description URINE, RANDOM    Special Requests NONE    Culture (A)     MULTIPLE SPECIES PRESENT, SUGGEST RECOLLECTION NO GROUP B STREP (S.AGALACTIAE) ISOLATED Performed at [facility], [place] [address], [place], [place] [address]    Report Status 08/11/2019 FINAL   Brain natriuretic peptide     Status: None   Collection Time: 08/09/19  6:30 PM  Result Value Ref Range   B Natriuretic Peptide 41.7 0.0 - 100.0 pg/mL    Comment: Performed at [place] [address], [place],  [address]  C-reactive protein     Status: Abnormal   Collection Time: 08/09/19  6:30 PM  Result Value Ref Range   CRP 6.3 (H) <1.0 mg/dL    Comment: Performed at [place] [address], [place],  [address]  Comprehensive metabolic panel     Status: Abnormal   Collection Time: 08/09/19  6:30 PM  Result Value Ref Range   Sodium 136 135 - 145 mmol/L   Potassium 3.9 3.5 - 5.1 mmol/L   Chloride 107 98 - 111 mmol/L   CO2 16 (L) 22 - 32 mmol/L   Glucose, Bld 79 70 - 99 mg/dL    Comment: Glucose reference range applies only to samples taken after fasting  for at least 8 hours.   BUN 5  (L) 6 - 20 mg/dL   Creatinine, Ser 1.610.96 0.44 - 1.00 mg/dL   Calcium 8.7 (L) 8.9 - 10.3 mg/dL   Total Protein 6.5 6.5 - 8.1 g/dL   Albumin 2.5 (L) 3.5 - 5.0 g/dL   AST 21 15 - 41 U/L   ALT 10 0 - 44 U/L   Alkaline Phosphatase 173 (H) 38 - 126 U/L   Total Bilirubin 0.8 0.3 - 1.2 mg/dL   GFR calc non Af Amer >60 >60 mL/min   GFR calc Af Amer >60 >60 mL/min   Anion gap 13 5 - 15    Comment: Performed at [facility], [address] [place], [place] [address]  CBC with Differential/Platelet     Status: Abnormal   Collection Time: 08/09/19  6:30 PM  Result Value Ref Range   WBC 6.1 4.0 - 10.5 K/uL   RBC 5.01 3.87 - 5.11 MIL/uL   Hemoglobin 12.0 12.0 - 15.0 g/dL   HCT 54.037.0 98.136.0 - 19.146.0 %   MCV 73.9 (L) 80.0 - 100.0 fL   MCH 24.0 (L) 26.0 - 34.0 pg   MCHC 32.4 30.0 - 36.0 g/dL   RDW 47.818.4 (H) 29.511.5 - 62.115.5 %   Platelets 170 150 - 400 K/uL   nRBC 0.0 0.0 - 0.2 %   Neutrophils Relative % 67 %   Neutro Abs 4.1 1.7 - 7.7 K/uL   Lymphocytes Relative 27 %   Lymphs Abs 1.7 0.7 - 4.0 K/uL   Monocytes Relative 4 %   Monocytes Absolute 0.2 0.1 - 1.0 K/uL   Eosinophils Relative 0 %   Eosinophils Absolute 0.0 0.0 - 0.5 K/uL   Basophils Relative 0 %   Basophils Absolute 0.0 0.0 - 0.1 K/uL   Immature Granulocytes 2 %   Abs Immature Granulocytes 0.13 (H) 0.00 - 0.07 K/uL    Comment: Performed at [facility], [address] [place], [place] [address]  D-dimer, quantitative (not at [facility])     Status: Abnormal   Collection Time: 08/09/19  6:30 PM  Result Value Ref Range   D-Dimer, Quant 2.78 (H) 0.00 - 0.50 ug/mL-FEU    Comment: (NOTE) At the manufacturer cut-off of 0.50 ug/mL FEU, this assay has been documented to exclude PE with a sensitivity and negative predictive value of 97 to 99%.  At this time, this assay has not been approved by the FDA to exclude DVT/VTE. Results should be correlated with clinical presentation. Performed at [facility], [address]  [place], [place] [address]   Ferritin     Status: None   Collection Time: 08/09/19  6:30 PM  Result Value Ref Range   Ferritin 33 11 - 307 ng/mL    Comment: Performed at [facility], [address] [place], [place] [address]  Lactate dehydrogenase     Status: None   Collection Time: 08/09/19  6:30 PM  Result Value Ref Range   LDH 191 98 - 192 U/L    Comment: Performed at [facility], [address] [place], [place] [address]  Procalcitonin     Status: None   Collection Time: 08/09/19  6:30 PM  Result Value Ref Range   Procalcitonin 0.15 ng/mL    Comment:        Interpretation: PCT (Procalcitonin) <= 0.5 ng/mL: Systemic infection (sepsis) is not likely. Local bacterial infection is possible. (NOTE)  Sepsis PCT Algorithm           Lower Respiratory Tract                                      Infection PCT Algorithm    ----------------------------     ----------------------------         PCT < 0.25 ng/mL                PCT < 0.10 ng/mL         Strongly encourage             Strongly discourage   discontinuation of antibiotics    initiation of antibiotics    ----------------------------     -----------------------------       PCT 0.25 - 0.50 ng/mL            PCT 0.10 - 0.25 ng/mL               OR       >80% decrease in PCT            Discourage initiation of                                            antibiotics      Encourage discontinuation           of antibiotics    ----------------------------     -----------------------------         PCT >= 0.50 ng/mL              PCT 0.26 - 0.50 ng/mL               AND        <80% decrease in PCT             Encourage initiation of                                             antibiotics       Encourage continuation           of antibiotics    ----------------------------     -----------------------------        PCT >= 0.50 ng/mL                  PCT > 0.50 ng/mL               AND         increase in PCT                   Strongly encourage                                      initiation of antibiotics    Strongly encourage escalation           of antibiotics                                     -----------------------------  PCT <= 0.25 ng/mL                                                 OR                                        > 80% decrease in PCT                                     Discontinue / Do not initiate                                             antibiotics Performed at [facility], [address], [place], [place] [address]   Type and screen [facility]     Status: None   Collection Time: 08/09/19  6:30 PM  Result Value Ref Range   ABO/RH(D) O POS    Antibody Screen NEG    Sample Expiration      08/12/2019,2359 Performed at [facility], [address] [place], [place] [address]   Hepatitis B surface antigen     Status: None   Collection Time: 08/09/19  6:30 PM  Result Value Ref Range   Hepatitis B Surface Ag NON REACTIVE NON REACTIVE    Comment: Performed at [facility], [address], [place], [place] [address]  Culture, blood (Routine X 2) w Reflex to ID Panel     Status: None (Preliminary result)   Collection Time: 08/09/19  6:30 PM   Specimen: BLOOD  Result Value Ref Range   Specimen Description BLOOD LEFT ANTECUBITAL    Special Requests      BOTTLES DRAWN AEROBIC AND ANAEROBIC Blood Culture adequate volume   Culture      NO GROWTH 2 DAYS Performed at [facility], [address], [place], [place] [address]    Report Status PENDING   Culture, blood (Routine X 2) w Reflex to ID Panel     Status: None (Preliminary result)   Collection Time: 08/09/19  6:45 PM   Specimen: BLOOD  Result Value Ref Range   Specimen Description BLOOD LEFT ANTECUBITAL    Special Requests      BOTTLES DRAWN AEROBIC ONLY Blood Culture results may not be optimal due to an excessive volume of blood received in  culture bottles   Culture      NO GROWTH 2 DAYS Performed at [facility], [address], [place], [place] [address]    Report Status PENDING   Lactic acid, plasma     Status: None   Collection Time: 08/09/19  8:19 PM  Result Value Ref Range   Lactic Acid, Venous 1.1 0.5 - 1.9 mmol/L    Comment: Performed at [facility], [address], [place], [place] [address]  CBC with Differential/Platelet     Status: Abnormal   Collection Time: 08/10/19  5:55 AM  Result Value Ref Range   WBC 6.6 4.0 - 10.5 K/uL   RBC 4.54 3.87 -  5.11 MIL/uL   Hemoglobin 10.6 (L) 12.0 - 15.0 g/dL   HCT 40.9 (L) 81.1 - 91.4 %   MCV 74.9 (L) 80.0 - 100.0 fL   MCH 23.3 (L) 26.0 - 34.0 pg   MCHC 31.2 30.0 - 36.0 g/dL   RDW 78.2 (H) 95.6 - 21.3 %   Platelets 163 150 - 400 K/uL   nRBC 0.3 (H) 0.0 - 0.2 %   Neutrophils Relative % 92 %   Neutro Abs 6.1 1.7 - 7.7 K/uL   Lymphocytes Relative 5 %   Lymphs Abs 0.3 (L) 0.7 - 4.0 K/uL   Monocytes Relative 2 %   Monocytes Absolute 0.1 0.1 - 1.0 K/uL   Eosinophils Relative 0 %   Eosinophils Absolute 0.0 0.0 - 0.5 K/uL   Basophils Relative 0 %   Basophils Absolute 0.0 0.0 - 0.1 K/uL   nRBC 0 0 /100 WBC   Myelocytes 1 %   Abs Immature Granulocytes 0.10 (H) 0.00 - 0.07 K/uL    Comment: Performed at [facility], [address], [place], [place] [address]  Comprehensive metabolic panel     Status: Abnormal   Collection Time: 08/10/19  5:55 AM  Result Value Ref Range   Sodium 135 135 - 145 mmol/L   Potassium 4.4 3.5 - 5.1 mmol/L   Chloride 106 98 - 111 mmol/L   CO2 15 (L) 22 - 32 mmol/L   Glucose, Bld 88 70 - 99 mg/dL    Comment: Glucose reference range applies only to samples taken after fasting for at least 8 hours.   BUN 7 6 - 20 mg/dL   Creatinine, Ser 8.46 0.44 - 1.00 mg/dL   Calcium 8.9 8.9 - 96.2 mg/dL   Total Protein 6.1 (L) 6.5 - 8.1 g/dL   Albumin 2.3 (L) 3.5 - 5.0 g/dL   AST 20 15 - 41 U/L   ALT 10 0 - 44 U/L   Alkaline Phosphatase  168 (H) 38 - 126 U/L   Total Bilirubin 0.8 0.3 - 1.2 mg/dL   GFR calc non Af Amer >60 >60 mL/min   GFR calc Af Amer >60 >60 mL/min   Anion gap 14 5 - 15    Comment: Performed at [facility], [address] [place], [place] [address]  C-reactive protein     Status: Abnormal   Collection Time: 08/10/19  5:55 AM  Result Value Ref Range   CRP 5.9 (H) <1.0 mg/dL    Comment: Performed at [facility], [address] [place], [place] [address]  D-dimer, quantitative (not at [facility])     Status: Abnormal   Collection Time: 08/10/19  5:55 AM  Result Value Ref Range   D-Dimer, Quant 2.08 (H) 0.00 - 0.50 ug/mL-FEU    Comment: (NOTE) At the manufacturer cut-off of 0.50 ug/mL FEU, this assay has been documented to exclude PE with a sensitivity and negative predictive value of 97 to 99%.  At this time, this assay has not been approved by the FDA to exclude DVT/VTE. Results should be correlated with clinical presentation. Performed at [facility], [address] [place], [place] [address]   Ferritin     Status: None   Collection Time: 08/10/19  5:55 AM  Result Value Ref Range   Ferritin 32 11 - 307 ng/mL    Comment: Performed at [facility], [address] [place], [place] [address]  Magnesium     Status:  None   Collection Time: 08/10/19  5:55 AM  Result Value Ref Range   Magnesium 2.0 1.7 - 2.4 mg/dL    Comment: Performed at [facility], [address] [place], [place] [address]  Phosphorus     Status: Abnormal   Collection Time: 08/10/19  5:55 AM  Result Value Ref Range   Phosphorus 4.8 (H) 2.5 - 4.6 mg/dL    Comment: Performed at [facility], [address], [place], [place] [address]  Lactic acid, plasma     Status: None   Collection Time: 08/10/19  5:55 AM  Result Value Ref Range   Lactic Acid, Venous 1.0 0.5 - 1.9 mmol/L    Comment: Performed at [facility], [address] [place], [place] [address]  CBC with Differential/Platelet      Status: Abnormal (Preliminary result)   Collection Time: 08/11/19  6:23 AM  Result Value Ref Range   WBC 9.3 4.0 - 10.5 K/uL   RBC 4.53 3.87 - 5.11 MIL/uL   Hemoglobin 10.7 (L) 12.0 - 15.0 g/dL   HCT 53.2 (L) 99.2 - 42.6 %   MCV 73.7 (L) 80.0 - 100.0 fL   MCH 23.6 (L) 26.0 - 34.0 pg   MCHC 32.0 30.0 - 36.0 g/dL   RDW 83.4 (H) 19.6 - 22.2 %   Platelets 152 150 - 400 K/uL    Comment: REPEATED TO VERIFY   nRBC 0.3 (H) 0.0 - 0.2 %    Comment: Performed at [facility], [address], [place], [place] [address]   Neutrophils Relative % PENDING %   Neutro Abs PENDING 1.7 - 7.7 K/uL   Band Neutrophils PENDING %   Lymphocytes Relative PENDING %   Lymphs Abs PENDING 0.7 - 4.0 K/uL   Monocytes Relative PENDING %   Monocytes Absolute PENDING 0.1 - 1.0 K/uL   Eosinophils Relative PENDING %   Eosinophils Absolute PENDING 0.0 - 0.5 K/uL   Basophils Relative PENDING %   Basophils Absolute PENDING 0.0 - 0.1 K/uL   WBC Morphology PENDING    RBC Morphology PENDING    Smear Review PENDING    Other PENDING %   nRBC PENDING 0 /100 WBC   Metamyelocytes Relative PENDING %   Myelocytes PENDING %   Promyelocytes Relative PENDING %   Blasts PENDING %   Immature Granulocytes PENDING %   Abs Immature Granulocytes PENDING 0.00 - 0.07 K/uL  Comprehensive metabolic panel     Status: Abnormal   Collection Time: 08/11/19  6:23 AM  Result Value Ref Range   Sodium 136 135 - 145 mmol/L   Potassium 3.9 3.5 - 5.1 mmol/L   Chloride 106 98 - 111 mmol/L   CO2 17 (L) 22 - 32 mmol/L   Glucose, Bld 105 (H) 70 - 99 mg/dL    Comment: Glucose reference range applies only to samples taken after fasting for at least 8 hours.   BUN 8 6 - 20 mg/dL   Creatinine, Ser 2.11 0.44 - 1.00 mg/dL   Calcium 8.8 (L) 8.9 - 10.3 mg/dL   Total Protein 5.9 (L) 6.5 - 8.1 g/dL   Albumin 2.1 (L) 3.5 - 5.0 g/dL   AST 22 15 - 41 U/L   ALT 8 0 - 44 U/L   Alkaline Phosphatase 160 (H) 38 - 126 U/L   Total Bilirubin 0.6 0.3 -  1.2 mg/dL   GFR calc non Af Amer >60 >60 mL/min  GFR calc Af Amer >60 >60 mL/min   Anion gap 13 5 - 15    Comment: Performed at [facility], [address] [place], [place] [address]  C-reactive protein     Status: Abnormal   Collection Time: 08/11/19  6:23 AM  Result Value Ref Range   CRP 3.8 (H) <1.0 mg/dL    Comment: Performed at [facility], [address] [place], [place] [address]  D-dimer, quantitative (not at [facility])     Status: Abnormal   Collection Time: 08/11/19  6:23 AM  Result Value Ref Range   D-Dimer, Quant 1.07 (H) 0.00 - 0.50 ug/mL-FEU    Comment: (NOTE) At the manufacturer cut-off of 0.50 ug/mL FEU, this assay has been documented to exclude PE with a sensitivity and negative predictive value of 97 to 99%.  At this time, this assay has not been approved by the FDA to exclude DVT/VTE. Results should be correlated with clinical presentation. Performed at [facility], [address] [place], [place] [address]   Ferritin     Status: None   Collection Time: 08/11/19  6:23 AM  Result Value Ref Range   Ferritin 40 11 - 307 ng/mL    Comment: Performed at [facility], [address], [place], [place] [address]   Imaging: DG Chest 1 View  Result Date: 08/09/2019 CLINICAL DATA:  Tachypnea. COVID-19 positive. EXAM: CHEST  1 VIEW COMPARISON:  11/17/2010 FINDINGS: There is scattered bilateral airspace opacities. There is no pneumothorax. No large pleural effusion. The heart size is normal. The lung volumes are somewhat low. There is no acute osseous abnormality. IMPRESSION: Multifocal airspace opacities concerning for multifocal pneumonia (viral or bacterial). Electronically Signed   By: Katherine Mantle M.D.   On: 08/09/2019 17:03    Current scheduled medications . vitamin C  500 mg Oral Daily  . dexamethasone (DECADRON) injection  6 mg Intravenous Q24H  . docusate sodium  100 mg Oral Daily  . enoxaparin (LOVENOX) injection  70 mg Subcutaneous  Q24H  . Ipratropium-Albuterol  1 puff Inhalation QID  . prenatal multivitamin  1 tablet Oral Q1200  . sodium chloride flush  3 mL Intravenous Once  . zinc sulfate  220 mg Oral Daily    I have reviewed the patient's current medications.  ASSESSMENT: Principal Problem:   COVID-19 infection affecting pregnancy in third trimester Active Problems:   Supervision of high-risk pregnancy   Maternal morbid obesity, antepartum (HCC)   Abnormal antenatal AFP screen   Polyhydramnios affecting pregnancy   Pneumonia due to COVID-19 virus   PLAN: - Continue Dexamethasone IV 6 mg daily and Combivent.  Antitussives also as needed.  - Continue Remdisivir - Follow up daily labs and TRH recommendations. - Continue Lovenox for VTE prophylaxis. - There is no current indication for delivery. If maternal or fetal condition condition deteriorates, delivery will be indicated. Patient consented for cesarean delivery if emergent delivery needed, but vaginal delivery is preferred route of delivery as long as there are no contraindications to this route of delivery.  Hope to keep patient pregnant until after [redacted] weeks gestation if possible, as long as patient and fetus remain stable.  - Continue routine antenatal care.

## 2019-08-11 NOTE — Progress Notes (Signed)
PROGRESS NOTE                                                                                                                                                                                                             Patient Demographics:    Michelle Gallagher, is a 24 y.o. female, DOB - Mar 06, 1996, ZOX:[id number]  Outpatient Primary MD for the patient is Patient, No Pcp Per   Admit date - 08/09/2019   LOS - 2  Chief Complaint  Patient presents with  . Emesis       Brief Narrative: Patient is a 24 y.o. female with remote history of bronchial asthma-[redacted] weeks pregnant-presenting with acute hypoxic respiratory failure secondary to COVID-19 pneumonia.  Significant Events: 5/4>> admit for hypoxia secondary to COVID-19  COVID-19 medications: Steroids: 5/4>> Remdesivir: 5/5>>  Antibiotics: None  Microbiology data: 5/4: Blood cultures>>no growth  DVT prophylaxis: SQ Lovenox  Procedures: None    Subjective:    Michelle Gallagher today has heartburn, back pain-and cannot get comfortable lying in bed.  She does still have some cough-minimal shortness of breath when she ambulates-but claims that she feels overall better than how she first presented to the hospital.  She was on room air the whole night-and was placed on just 1 L for comfort.   Assessment  & Plan :   Acute Hypoxic Resp Failure due to Covid 19 Viral pneumonia: Improving-on very minimal oxygen supplementation.  CRP trending down.  Continue steroids/remdesivir.    Fever: afebrile/  O2 requirements:  SpO2: 98 % O2 Flow Rate (L/min): 1 L/min   COVID-19 Labs: Recent Labs    08/09/19 1830 08/10/19 0555 08/11/19 0623  DDIMER 2.78* 2.08* 1.07*  FERRITIN 33 32 40  LDH 191  --   --   CRP 6.3* 5.9* 3.8*       Component Value Date/Time   BNP 41.7 08/09/2019 1830    Recent Labs  Lab 08/09/19 1830  PROCALCITON 0.15    Lab Results  Component  Value Date   SARSCOV2NAA POSITIVE (A) 08/06/2019     Prone/Incentive Spirometry: encouraged incentive spirometry use 3-4/hour.  37 weeks term gestation: Per primary service.  ABG: No results found for: PHART, PCO2ART, PO2ART, HCO3, TCO2, ACIDBASEDEF, O2SAT  Vent Settings: N/A    Condition - Stable  Family Communication  : Patient wants to  update family herself.  I have asked her to let me know if they have any Covid related pneumonia questions.  Code Status :  Full Code  Diet :  Diet Order            Diet regular Room service appropriate? Yes; Fluid consistency: Thin  Diet effective now               Disposition Plan  : Per primary service.  Antimicorbials  :    Anti-infectives (From admission, onward)   Start     Dose/Rate Route Frequency Ordered Stop   08/11/19 1000  remdesivir 100 mg in sodium chloride 0.9 % 100 mL IVPB     100 mg 200 mL/hr over 30 Minutes Intravenous Daily 08/10/19 0916 08/15/19 0959   08/10/19 1030  remdesivir 100 mg in sodium chloride 0.9 % 100 mL IVPB     100 mg 200 mL/hr over 30 Minutes Intravenous  Once 08/10/19 0916 08/10/19 1146   08/10/19 1000  remdesivir 100 mg in sodium chloride 0.9 % 100 mL IVPB     100 mg 200 mL/hr over 30 Minutes Intravenous  Once 08/10/19 0916 08/10/19 1109   08/09/19 2200  nitrofurantoin (macrocrystal-monohydrate) (MACROBID) capsule 100 mg  Status:  Discontinued     100 mg Oral Every 12 hours 08/09/19 1750 08/09/19 1753   08/09/19 1800  fluconazole (DIFLUCAN) tablet 150 mg     150 mg Oral  Once 08/09/19 1750 08/09/19 1954      Inpatient Medications  Scheduled Meds: . vitamin C  500 mg Oral Daily  . dexamethasone (DECADRON) injection  6 mg Intravenous Q24H  . docusate sodium  100 mg Oral Daily  . enoxaparin (LOVENOX) injection  70 mg Subcutaneous Q24H  . Ipratropium-Albuterol  1 puff Inhalation QID  . prenatal multivitamin  1 tablet Oral Q1200  . sodium chloride flush  3 mL Intravenous Once  . zinc  sulfate  220 mg Oral Daily   Continuous Infusions: . lactated ringers 75 mL/hr at 08/11/19 0744  . remdesivir 100 mg in NS 100 mL     PRN Meds:.acetaminophen, calcium carbonate, chlorpheniramine-HYDROcodone, guaiFENesin-dextromethorphan, zolpidem   Time Spent in minutes  25  See all Orders from today for further details   Jeoffrey Massed M.D on 08/11/2019 at 9:38 AM  To page go to [email or web address] - use universal password  [facility] -  Office  [phone]    Objective:   Vitals:   08/11/19 0905 08/11/19 0910 08/11/19 0915 08/11/19 0920  BP:      Pulse:      Resp:      Temp:      TempSrc:      SpO2: 98% 97% 100% 98%  Weight:      Height:        Wt Readings from Last 3 Encounters:  08/09/19 131.5 kg  08/03/19 131.6 kg  07/28/19 133.4 kg     Intake/Output Summary (Last 24 hours) at 08/11/2019 0938 Last data filed at 08/11/2019 0747 Gross per 24 hour  Intake 3551.08 ml  Output 1175 ml  Net 2376.08 ml     Physical Exam Gen Exam:Alert awake-not in any distress HEENT:atraumatic, normocephalic Chest: B/L clear to auscultation anteriorly CVS:S1S2 regular Abdomen:soft non tender Extremities:no edema Neurology: Non focal Skin: no rash   Data Review:    CBC Recent Labs  Lab 08/09/19 1250 08/09/19 1830 08/10/19 0555 08/11/19 0623  WBC 7.1 6.1 6.6 9.3  HGB 11.7* 12.0 10.6* 10.7*  HCT 37.8 37.0 34.0* 33.4*  PLT 162 170 163 152  MCV 74.7* 73.9* 74.9* 73.7*  MCH 23.1* 24.0* 23.3* 23.6*  MCHC 31.0 32.4 31.2 32.0  RDW 18.8* 18.4* 18.6* 18.7*  LYMPHSABS  --  1.7 0.3* 0.4*  MONOABS  --  0.2 0.1 0.2  EOSABS  --  0.0 0.0 0.0  BASOSABS  --  0.0 0.0 0.0    Chemistries  Recent Labs  Lab 08/09/19 1250 08/09/19 1830 08/10/19 0555 08/11/19 0623  NA 135 136 135 136  K 3.8 3.9 4.4 3.9  CL 106 107 106 106  CO2 16* 16* 15* 17*  GLUCOSE 83 79 88 105*  BUN <5* 5* 7 8  CREATININE 0.99 0.96 0.99 0.84  CALCIUM 8.7* 8.7* 8.9 8.8*  MG  --   --  2.0  --     AST ALT ALKPHOS 173* 173* 168* 160*  BILITOT 0.7 0.8 0.8 0.6   ------------------------------------------------------------------------------------------------------------------ No results for input(s): CHOL, HDL, LDLCALC, TRIG, CHOLHDL, LDLDIRECT in the last 72 hours.  Lab Results  Component Value Date   HGBA1C 5.6 02/04/2019   ------------------------------------------------------------------------------------------------------------------ No results for input(s): TSH, T4TOTAL, T3FREE, THYROIDAB in the last 72 hours.  Invalid input(s): FREET3 ------------------------------------------------------------------------------------------------------------------ Recent Labs    08/10/19 0555 08/11/19 0623  FERRITIN 32 40    Coagulation profile No results for input(s): INR, PROTIME in the last 168 hours.  Recent Labs    08/10/19 0555 08/11/19 0623  DDIMER 2.08* 1.07*    Cardiac Enzymes No results for input(s): CKMB, TROPONINI, MYOGLOBIN in the last 168 hours.  Invalid input(s): CK ------------------------------------------------------------------------------------------------------------------    Component Value Date/Time   BNP 41.7 08/09/2019 1830    Micro Results Recent Results (from the past 240 hour(s))  Respiratory Panel by RT PCR (Flu A&B, Covid) - Nasopharyngeal Swab     Status: Abnormal   Collection Time: 08/06/19  9:46 PM   Specimen: Nasopharyngeal Swab  Result Value Ref Range Status   SARS Coronavirus 2 by RT PCR POSITIVE (A) NEGATIVE Final    Comment: RESULT CALLED TO, READ BACK BY AND VERIFIED WITH: RN G BELLAMY  08/07/19 BY S GEZAHEGN (NOTE) SARS-CoV-2 target nucleic acids are DETECTED. SARS-CoV-2 RNA is generally detectable in upper respiratory specimens  during the acute phase of infection. Positive results are indicative of the presence of the identified virus, but do not rule out bacterial infection or co-infection with  other pathogens not detected by the test. Clinical correlation with patient history and other diagnostic information is necessary to determine patient infection status. The expected result is Negative. Fact Sheet for Patients:  https://www.moore.com/ Fact Sheet for Healthcare Providers: https://www.young.biz/ This test is not yet approved or cleared by the [place] FDA and  has been authorized for detection and/or diagnosis of SARS-CoV-2 by FDA under an Emergency Use Authorization (EUA).  This EUA will remain in effect (meaning this test can be use d) for the duration of  the COVID-19 declaration under Section 564(b)(1) of the Act, 21 U.S.C. section 360bbb-3(b)(1), unless the authorization is terminated or revoked sooner.    Influenza A by PCR NEGATIVE NEGATIVE Final   Influenza B by PCR NEGATIVE NEGATIVE Final    Comment: (NOTE) The Xpert Xpress SARS-CoV-2/FLU/RSV assay is intended as an aid in  the diagnosis of influenza from Nasopharyngeal swab specimens and  should not be used as a sole basis for treatment. Nasal washings and  aspirates are unacceptable  for Xpert Xpress SARS-CoV-2/FLU/RSV  testing. Fact Sheet for Patients: PinkCheek.be Fact Sheet for Healthcare Providers: GravelBags.it This test is not yet approved or cleared by the [place] FDA and  has been authorized for detection and/or diagnosis of SARS-CoV-2 by  FDA under an Emergency Use Authorization (EUA). This EUA will remain  in effect (meaning this test can be used) for the duration of the  Covid-19 declaration under Section 564(b)(1) of the Act, 21  U.S.C. section 360bbb-3(b)(1), unless the authorization is  terminated or revoked. Performed at [facility], [place] [address] [place], [place] [address]   Culture, [place] Urine     Status: Abnormal   Collection Time: 08/09/19  3:30 PM   Specimen: Urine, Random   Result Value Ref Range Status   Specimen Description URINE, RANDOM  Final   Special Requests NONE  Final   Culture (A)  Final    MULTIPLE SPECIES PRESENT, SUGGEST RECOLLECTION NO GROUP B STREP (S.AGALACTIAE) ISOLATED Performed at [facility], [place] [address] [place], [place] [address]    Report Status 08/11/2019 FINAL  Final  Culture, blood (Routine X 2) w Reflex to ID Panel     Status: None (Preliminary result)   Collection Time: 08/09/19  6:30 PM   Specimen: BLOOD  Result Value Ref Range Status   Specimen Description BLOOD LEFT ANTECUBITAL  Final   Special Requests   Final    BOTTLES DRAWN AEROBIC AND ANAEROBIC Blood Culture adequate volume   Culture   Final    NO GROWTH 2 DAYS Performed at [facility], [place] [address] [place], [place] [address]    Report Status PENDING  Incomplete  Culture, blood (Routine X 2) w Reflex to ID Panel     Status: None (Preliminary result)   Collection Time: 08/09/19  6:45 PM   Specimen: BLOOD  Result Value Ref Range Status   Specimen Description BLOOD LEFT ANTECUBITAL  Final   Special Requests   Final    BOTTLES DRAWN AEROBIC ONLY Blood Culture results may not be optimal due to an excessive volume of blood received in culture bottles   Culture   Final    NO GROWTH 2 DAYS Performed at [facility], [place] [address] [place], [place] [address]    Report Status PENDING  Incomplete    Radiology Reports DG Chest 1 View  Result Date: 08/09/2019 CLINICAL DATA:  Tachypnea. COVID-19 positive. EXAM: CHEST  1 VIEW COMPARISON:  11/17/2010 FINDINGS: There is scattered bilateral airspace opacities. There is no pneumothorax. No large pleural effusion. The heart size is normal. The lung volumes are somewhat low. There is no acute osseous abnormality. IMPRESSION: Multifocal airspace opacities concerning for multifocal pneumonia (viral or bacterial). Electronically Signed   By: Constance Holster M.D.   On: 08/09/2019 17:03   [place] MFM OB FOLLOW  UP  Result Date: 07/14/2019 ----------------------------------------------------------------------  OBSTETRICS REPORT                       (Signed Final 07/14/2019 03:38 pm) ---------------------------------------------------------------------- Patient Info  ID #:       [id number]                          D.O.B.:  [birth year] (23 yrs)  Name:       ARRIANA LOHMANN Slee              Visit Date: 07/14/2019 02:33  pm ---------------------------------------------------------------------- Performed By  Performed By:     Lenise Arena        Ref. Address:     [address]                                                             [place], [place]  Attending:        Ma Rings MD         [facility]  Referred By:      [facility] ---------------------------------------------------------------------- Orders   #  Description                          Code         Ordered By   1  [place] MFM OB FOLLOW UP                  16109.60     Rosana Hoes  ----------------------------------------------------------------------   #  Order #                    Accession #                 Episode #   1  [id number]                  [id number]                  [id number]  ---------------------------------------------------------------------- Indications   Polyhydramnios, third trimester, antepartum    O40.3XX0   condition or complication, unspecified fetus   Obesity complicating pregnancy, third          O99.213   trimester   Abnormal biochemical screen (AFP positive)     O28.9   Encounter for other antenatal screening        Z36.2   follow-up (low risk NIPS, 3.3FF)   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 Cord Insertion:      Previously Visualized  Amniotic Fluid  AFI FV:      Within normal limits  AFI Sum(cm)     %Tile  Largest Pocket(cm)  28.96           > 97        8.75  RUQ(cm)       RLQ(cm)       LUQ(cm)        LLQ(cm)  7.87          5.96          8.75           6.38  Comment:    Good fetal moevment, tone, and breathing ---------------------------------------------------------------------- Biometry  BPD:      83.7  mm     G. Age:  33w 5d         38  %    CI:        73.38   %    70 - 86                                                          FL/HC:      20.9   %    19.4 - 21.8  HC:      310.5  mm     G. Age:  34w 5d         31  %    HC/AC:      0.97        0.96 - 1.11  AC:      319.7  mm     G. Age:  35w 6d         94  %    FL/BPD:     77.7   %    71 - 87  FL:         65  mm     G. Age:  33w 4d         28  %    FL/AC:      20.3   %    20 - 24  Est. FW:    2547  gm    5 lb 10 oz      71  % ---------------------------------------------------------------------- OB History  Gravidity:    1         Term:   0        Prem:   0        SAB:   0  TOP:          0       Ectopic:  0        Living: 0 ---------------------------------------------------------------------- Gestational Age  LMP:           35w 0d        Date:  11/11/18                 EDD:   08/18/19  U/S Today:     34w 3d                                        EDD:   08/22/19  Best:          34w 0d     Det. ByMarcella Dubs         EDD:   08/25/19                                      (  01/03/19) ---------------------------------------------------------------------- Anatomy  Cranium:               Previously seen        Aortic Arch:            Previously seen  Cavum:                 Previously seen        Ductal Arch:            Previously seen  Ventricles:            Appears normal         Diaphragm:              Appears normal  Choroid Plexus:         Previously seen        Stomach:                Appears normal, left                                                                        sided  Cerebellum:            Previously seen        Abdomen:                Previously seen  Posterior Fossa:       Previously seen        Abdominal Wall:         Previously seen  Nuchal Fold:           Not applicable (>20    Cord Vessels:           Previously seen                         wks GA)  Face:                  Orbits and profile     Kidneys:                Appear normal                         previously seen  Lips:                  Previously seen        Bladder:                Appears normal  Thoracic:              Previously seen        Spine:                  Previously seen  Heart:                 Appears normal         Upper Extremities:      Previously seen                         (4CH, axis, and  situs)  RVOT:                  Previously seen        Lower Extremities:      Previously seen  LVOT:                  Previously seen  Other:  Nasal bone previously seen.  Hands & Feet visualized previously.          Technically difficult due to maternal habitus and fetal position. ---------------------------------------------------------------------- Cervix Uterus Adnexa  Cervix  Not visualized (advanced GA >24wks) ---------------------------------------------------------------------- Comments  This patient was seen for a follow up growth scan due to  abnormal serum analytes noted on her quad screen.  She  denies any problems since her last exam.  She was informed that the fetal growth appears appropriate  for her gestational age.  Polyhydramnios with a total AFI of  over 28 cm was noted today.  The patient reports that she  has screened negative for gestational diabetes.  A follow up exam was scheduled in 4 weeks. ----------------------------------------------------------------------                   Ma Rings, MD Electronically Signed Final  Report   07/14/2019 03:38 pm ----------------------------------------------------------------------

## 2019-08-11 NOTE — Progress Notes (Signed)
CSW acknowledges consult and completed chart review. MOB is currently residing at [facility] and is provided with a wealth of community resources for housing, mental health, parenting, and any other resources requested by MOB.  MOB also has a case Production designer, theatre/television/film that MOB meets with weekly at [facility].    Please contact the Clinical Social Worker if needs arise, by [facility] request, or if MOB scores greater than 9/yes to question 10 on [place] Postpartum Depression Screen.  CSW identifies no further need for intervention and no barriers to discharge at this time.  Blaine Hamper, MSW, LCSW Clinical Social Work [phone]

## 2019-08-12 ENCOUNTER — Ambulatory Visit: Payer: Medicaid Other

## 2019-08-12 LAB — CBC WITH DIFFERENTIAL/PLATELET
Abs Immature Granulocytes: 1.3 10*3/uL — ABNORMAL HIGH (ref 0.00–0.07)
Basophils Absolute: 0.1 10*3/uL (ref 0.0–0.1)
Basophils Relative: 1 %
Eosinophils Absolute: 0 10*3/uL (ref 0.0–0.5)
Eosinophils Relative: 0 %
HCT: 35 % — ABNORMAL LOW (ref 36.0–46.0)
Hemoglobin: 11 g/dL — ABNORMAL LOW (ref 12.0–15.0)
Immature Granulocytes: 12 %
Lymphocytes Relative: 25 %
Lymphs Abs: 2.6 10*3/uL (ref 0.7–4.0)
MCH: 23.1 pg — ABNORMAL LOW (ref 26.0–34.0)
MCHC: 31.4 g/dL (ref 30.0–36.0)
MCV: 73.5 fL — ABNORMAL LOW (ref 80.0–100.0)
Monocytes Absolute: 0.4 10*3/uL (ref 0.1–1.0)
Monocytes Relative: 4 %
Neutro Abs: 6.2 10*3/uL (ref 1.7–7.7)
Neutrophils Relative %: 58 %
Platelets: 165 10*3/uL (ref 150–400)
RBC: 4.76 MIL/uL (ref 3.87–5.11)
RDW: 18.7 % — ABNORMAL HIGH (ref 11.5–15.5)
WBC: 10.6 10*3/uL — ABNORMAL HIGH (ref 4.0–10.5)
nRBC: 0.9 % — ABNORMAL HIGH (ref 0.0–0.2)

## 2019-08-12 LAB — COMPREHENSIVE METABOLIC PANEL
ALT: 11 U/L (ref 0–44)
AST: 25 U/L (ref 15–41)
Albumin: 2.1 g/dL — ABNORMAL LOW (ref 3.5–5.0)
Alkaline Phosphatase: 158 U/L — ABNORMAL HIGH (ref 38–126)
Anion gap: 12 (ref 5–15)
BUN: 9 mg/dL (ref 6–20)
CO2: 20 mmol/L — ABNORMAL LOW (ref 22–32)
Calcium: 8.7 mg/dL — ABNORMAL LOW (ref 8.9–10.3)
Chloride: 108 mmol/L (ref 98–111)
Creatinine, Ser: 0.88 mg/dL (ref 0.44–1.00)
GFR calc Af Amer: >60 mL/min (ref >60)
GFR calc non Af Amer: >60 mL/min (ref >60)
Glucose, Bld: 94 mg/dL (ref 70–99)
Potassium: 4 mmol/L (ref 3.5–5.1)
Sodium: 140 mmol/L (ref 135–145)
Total Bilirubin: 0.6 mg/dL (ref 0.3–1.2)
Total Protein: 5.8 g/dL — ABNORMAL LOW (ref 6.5–8.1)

## 2019-08-12 LAB — C-REACTIVE PROTEIN: CRP: 5.4 mg/dL — ABNORMAL HIGH (ref <1.0)

## 2019-08-12 LAB — D-DIMER, QUANTITATIVE: D-Dimer, Quant: 0.87 ug/mL-FEU — ABNORMAL HIGH (ref 0.00–0.50)

## 2019-08-12 LAB — FERRITIN: Ferritin: 41 ng/mL (ref 11–307)

## 2019-08-12 NOTE — Progress Notes (Signed)
PROGRESS NOTE                                                                                                                                                                                                             Patient Demographics:    Michelle Gallagher, is a 24 y.o. female, DOB - [birth year], ZOX:[id number]  Outpatient Primary MD for the patient is Patient, No Pcp Per   Admit date - 08/09/2019   LOS - 3  Chief Complaint  Patient presents with  . Emesis       Brief Narrative: Patient is a 24 y.o. female with remote history of bronchial asthma-[redacted] weeks pregnant-presenting with acute hypoxic respiratory failure secondary to COVID-19 pneumonia.  Significant Events: 5/4>> admit for hypoxia secondary to COVID-19  COVID-19 medications: Steroids: 5/4>> Remdesivir: 5/5>>  Antibiotics: None  Microbiology data: 5/4: Blood cultures>>no growth  DVT prophylaxis: SQ Lovenox  Procedures: None    Subjective:   No chest pain or shortness of breath-minimal shortness of breath when she walks to the bathroom per patient.  Titrated to room air.   Assessment  & Plan :   Acute Hypoxic Resp Failure due to Covid 19 Viral pneumonia: Remains stable-on room air this morning.  CRP only minimally elevated.  Continue steroids/remdesivir.  Encouraged regarding mobility/incentive spirometry use.    Fever: afebrile/  O2 requirements:  SpO2: 99 % O2 Flow Rate (L/min): 1 L/min   COVID-19 Labs: Recent Labs    08/09/19 1830 08/09/19 1830 08/10/19 0555 08/11/19 0623 08/12/19 0617  DDIMER 2.78*   < > 2.08* 1.07* 0.87*  FERRITIN 33   < > 32 40 41  LDH 191  --   --   --   --   CRP 6.3*   < > 5.9* 3.8* 5.4*   < > = values in this interval not displayed.       Component Value Date/Time   BNP 41.7 08/09/2019 1830    Recent Labs  Lab 08/09/19 1830  PROCALCITON 0.15    Lab Results  Component Value Date   SARSCOV2NAA  POSITIVE (A) 08/06/2019     Prone/Incentive Spirometry: encouraged incentive spirometry use 3-4/hour.  37 weeks term gestation: Per primary service.  ABG: No results found for: PHART, PCO2ART, PO2ART, HCO3, TCO2, ACIDBASEDEF, O2SAT  Vent Settings: N/A    Condition -  Stable  Family Communication  : Patient wants to update family herself.  I have asked her to let me know if they have any Covid related pneumonia questions.  Code Status :  Full Code  Diet :  Diet Order            Diet regular Room service appropriate? Yes; Fluid consistency: Thin  Diet effective now               Disposition Plan  : Per primary service.  Antimicorbials  :    Anti-infectives (From admission, onward)   Start     Dose/Rate Route Frequency Ordered Stop   08/11/19 1000  remdesivir 100 mg in sodium chloride 0.9 % 100 mL IVPB     100 mg 200 mL/hr over 30 Minutes Intravenous Daily 08/10/19 0916 08/15/19 0959   08/10/19 1030  remdesivir 100 mg in sodium chloride 0.9 % 100 mL IVPB     100 mg 200 mL/hr over 30 Minutes Intravenous  Once 08/10/19 0916 08/10/19 1146   08/10/19 1000  remdesivir 100 mg in sodium chloride 0.9 % 100 mL IVPB     100 mg 200 mL/hr over 30 Minutes Intravenous  Once 08/10/19 0916 08/10/19 1109   08/09/19 2200  nitrofurantoin (macrocrystal-monohydrate) (MACROBID) capsule 100 mg  Status:  Discontinued     100 mg Oral Every 12 hours 08/09/19 1750 08/09/19 1753   08/09/19 1800  fluconazole (DIFLUCAN) tablet 150 mg     150 mg Oral  Once 08/09/19 1750 08/09/19 1954      Inpatient Medications  Scheduled Meds: . vitamin C  500 mg Oral Daily  . dexamethasone (DECADRON) injection  6 mg Intravenous Q24H  . docusate sodium  100 mg Oral Daily  . enoxaparin (LOVENOX) injection  70 mg Subcutaneous Q24H  . Ipratropium-Albuterol  1 puff Inhalation QID  . prenatal multivitamin  1 tablet Oral Q1200  . sodium chloride flush  3 mL Intravenous Once  . zinc sulfate  220 mg Oral Daily    Continuous Infusions: . lactated ringers 75 mL/hr at 08/11/19 0744  . remdesivir 100 mg in NS 100 mL 100 mg (08/12/19 0936)   PRN Meds:.acetaminophen, calcium carbonate, chlorpheniramine-HYDROcodone, guaiFENesin-dextromethorphan, ondansetron (ZOFRAN) IV, zolpidem   Time Spent in minutes  15  See all Orders from today for further details   Jeoffrey Massed M.D on 08/12/2019 at 2:06 PM  To page go to [email or web address] - use universal password  [facility] -  Office  [phone]    Objective:   Vitals:   08/12/19 0355 08/12/19 0815 08/12/19 0818 08/12/19 1221  BP: (!) 110/56  121/80 125/80  Pulse: 63  70 82  Resp: 18  (!) 40 (!) 36  Temp: 97.7 F (36.5 C)  97.8 F (36.6 C) 97.6 F (36.4 C)  TempSrc: Oral   Oral  SpO2: 98% 99%    Weight:      Height:        Wt Readings from Last 3 Encounters:  08/09/19 131.5 kg  08/03/19 131.6 kg  07/28/19 133.4 kg     Intake/Output Summary (Last 24 hours) at 08/12/2019 1406 Last data filed at 08/12/2019 0200 Gross per 24 hour  Intake 976.98 ml  Output 330 ml  Net 646.98 ml     Physical Exam Gen Exam:Alert awake-not in any distress HEENT:atraumatic, normocephalic Chest: B/L clear to auscultation anteriorly CVS:S1S2 regular Abdomen:soft non tender Extremities:no edema Neurology: Non focal Skin: no rash   Data Review:  CBC Recent Labs  Lab 08/09/19 1250 08/09/19 1830 08/10/19 0555 08/11/19 0623 08/12/19 0617  WBC 7.1 6.1 6.6 9.3 10.6*  HGB 11.7* 12.0 10.6* 10.7* 11.0*  HCT 37.8 37.0 34.0* 33.4* 35.0*  PLT 162 170 163 152 165  MCV 74.7* 73.9* 74.9* 73.7* 73.5*  MCH 23.1* 24.0* 23.3* 23.6* 23.1*  MCHC 31.0 32.4 31.2 32.0 31.4  RDW 18.8* 18.4* 18.6* 18.7* 18.7*  LYMPHSABS  --  1.7 0.3* 0.4* 2.6  MONOABS  --  0.2 0.1 0.2 0.4  EOSABS  --  0.0 0.0 0.0 0.0  BASOSABS  --  0.0 0.0 0.0 0.1    Chemistries  Recent Labs  Lab 08/09/19 1250 08/09/19 1830 08/10/19 0555 08/11/19 0623 08/12/19 0617  NA 135 136  135 136 140  K 3.8 3.9 4.4 3.9 4.0  CL 106 107 106 106 108  CO2 16* 16* 15* 17* 20*  GLUCOSE 83 79 88 105* 94  BUN <5* 5* 7 8 9   CREATININE 0.99 0.96 0.99 0.84 0.88  CALCIUM 8.7* 8.7* 8.9 8.8* 8.7*  MG  --   --  2.0  --   --   AST 20 21 20 22 25   ALT 9 10 10 8 11   ALKPHOS 173* 173* 168* 160* 158*  BILITOT 0.7 0.8 0.8 0.6 0.6   ------------------------------------------------------------------------------------------------------------------ No results for input(s): CHOL, HDL, LDLCALC, TRIG, CHOLHDL, LDLDIRECT in the last 72 hours.  Lab Results  Component Value Date   HGBA1C 5.6 02/04/2019   ------------------------------------------------------------------------------------------------------------------ No results for input(s): TSH, T4TOTAL, T3FREE, THYROIDAB in the last 72 hours.  Invalid input(s): FREET3 ------------------------------------------------------------------------------------------------------------------ Recent Labs    08/11/19 0623 08/12/19 0617  FERRITIN 40 41    Coagulation profile No results for input(s): INR, PROTIME in the last 168 hours.  Recent Labs    08/11/19 0623 08/12/19 0617  DDIMER 1.07* 0.87*    Cardiac Enzymes No results for input(s): CKMB, TROPONINI, MYOGLOBIN in the last 168 hours.  Invalid input(s): CK ------------------------------------------------------------------------------------------------------------------    Component Value Date/Time   BNP 41.7 08/09/2019 1830    Micro Results Recent Results (from the past 240 hour(s))  Respiratory Panel by RT PCR (Flu A&B, Covid) - Nasopharyngeal Swab     Status: Abnormal   Collection Time: 08/06/19  9:46 PM   Specimen: Nasopharyngeal Swab  Result Value Ref Range Status   SARS Coronavirus 2 by RT PCR POSITIVE (A) NEGATIVE Final    Comment: RESULT CALLED TO, READ BACK BY AND VERIFIED WITH: RN G BELLAMY @0017  08/07/19 BY S GEZAHEGN (NOTE) SARS-CoV-2 target nucleic acids are  DETECTED. SARS-CoV-2 RNA is generally detectable in upper respiratory specimens  during the acute phase of infection. Positive results are indicative of the presence of the identified virus, but do not rule out bacterial infection or co-infection with other pathogens not detected by the test. Clinical correlation with patient history and other diagnostic information is necessary to determine patient infection status. The expected result is Negative. Fact Sheet for Patients:  10/09/2019 Fact Sheet for Healthcare Providers: 10/06/19 This test is not yet approved or cleared by the FDA and  has been authorized for detection and/or diagnosis of SARS-CoV-2 by FDA under an Emergency Use Authorization (EUA).  This EUA will remain in effect (meaning this test can be use d) for the duration of  the COVID-19 declaration under Section 564(b)(1) of the Act, 21 U.S.C. section 360bbb-3(b)(1), unless the authorization is terminated or revoked sooner.    Influenza A by PCR  NEGATIVE NEGATIVE Final   Influenza B by PCR NEGATIVE NEGATIVE Final    Comment: (NOTE) The Xpert Xpress SARS-CoV-2/FLU/RSV assay is intended as an aid in  the diagnosis of influenza from Nasopharyngeal swab specimens and  should not be used as a sole basis for treatment. Nasal washings and  aspirates are unacceptable for Xpert Xpress SARS-CoV-2/FLU/RSV  testing. Fact Sheet for Patients: https://www.moore.com/ Fact Sheet for Healthcare Providers: https://www.young.biz/ This test is not yet approved or cleared by the [place] FDA and  has been authorized for detection and/or diagnosis of SARS-CoV-2 by  FDA under an Emergency Use Authorization (EUA). This EUA will remain  in effect (meaning this test can be used) for the duration of the  Covid-19 declaration under Section 564(b)(1) of the Act, 21  U.S.C.  section 360bbb-3(b)(1), unless the authorization is  terminated or revoked. Performed at [facility], [address], [place], [place] [address]   Culture, [place] Urine     Status: Abnormal   Collection Time: 08/09/19  3:30 PM   Specimen: Urine, Random  Result Value Ref Range Status   Specimen Description URINE, RANDOM  Final   Special Requests NONE  Final   Culture (A)  Final    MULTIPLE SPECIES PRESENT, SUGGEST RECOLLECTION NO GROUP B STREP (S.AGALACTIAE) ISOLATED Performed at [facility], [address] [place], [place] [address]    Report Status 08/11/2019 FINAL  Final  Culture, blood (Routine X 2) w Reflex to ID Panel     Status: None (Preliminary result)   Collection Time: 08/09/19  6:30 PM   Specimen: BLOOD  Result Value Ref Range Status   Specimen Description BLOOD LEFT ANTECUBITAL  Final   Special Requests   Final    BOTTLES DRAWN AEROBIC AND ANAEROBIC Blood Culture adequate volume   Culture   Final    NO GROWTH 3 DAYS Performed at [facility], [address] [place], [place] [address]    Report Status PENDING  Incomplete  Culture, blood (Routine X 2) w Reflex to ID Panel     Status: None (Preliminary result)   Collection Time: 08/09/19  6:45 PM   Specimen: BLOOD  Result Value Ref Range Status   Specimen Description BLOOD LEFT ANTECUBITAL  Final   Special Requests   Final    BOTTLES DRAWN AEROBIC ONLY Blood Culture results may not be optimal due to an excessive volume of blood received in culture bottles   Culture   Final    NO GROWTH 3 DAYS Performed at [facility], [address], [place], [place] [address]    Report Status PENDING  Incomplete    Radiology Reports DG Chest 1 View  Result Date: 08/09/2019 CLINICAL DATA:  Tachypnea. COVID-19 positive. EXAM: CHEST  1 VIEW COMPARISON:  11/17/2010 FINDINGS: There is scattered bilateral airspace opacities. There is no pneumothorax. No large pleural effusion. The heart size is normal. The  lung volumes are somewhat low. There is no acute osseous abnormality. IMPRESSION: Multifocal airspace opacities concerning for multifocal pneumonia (viral or bacterial). Electronically Signed   By: Katherine Mantle M.D.   On: 08/09/2019 17:03   [place] MFM OB FOLLOW UP  Result Date: 07/14/2019 ----------------------------------------------------------------------  OBSTETRICS REPORT                       (Signed Final 07/14/2019 03:38 pm) ---------------------------------------------------------------------- Patient Info  ID #:       [id number]  D.O.B.:  October 27, 1995 (23 yrs)  Name:       Michelle Gallagher              Visit Date: 07/14/2019 02:33 pm ---------------------------------------------------------------------- Performed By  Performed By:     Lenise ArenaHannah Bazemore        Ref. Address:     [address]                    [facility]                                                             [place], [place]  Attending:        Ma RingsVictor Fang MD         [facility]  Referred By:      [facility] [place] ---------------------------------------------------------------------- Orders   #  Description                          Code         Ordered By   1  US MFM OB FOLLOW UP                  [id number]     Rosana HoesYU FANG  ----------------------------------------------------------------------   #  Order #                    Accession #                 Episode #   1  [id number]                  [id number]                  [id number]  ---------------------------------------------------------------------- Indications   Polyhydramnios, third trimester, antepartum    O40.3XX0   condition or complication, unspecified fetus   Obesity complicating pregnancy, third          O99.213   trimester   Abnormal biochemical screen (AFP positive)     O28.9   Encounter for other antenatal screening        Z36.2   follow-up (low risk NIPS, 3.3FF)    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  Cord Insertion:      Previously Visualized  Amniotic Fluid  AFI FV:      Within normal limits  AFI Sum(cm)     %Tile       Largest Pocket(cm)  28.96           > 97        8.75  RUQ(cm)       RLQ(cm)       LUQ(cm)        LLQ(cm)  7.87          5.96          8.75           6.38  Comment:    Good fetal moevment, tone, and breathing ---------------------------------------------------------------------- Biometry  BPD:      83.7  mm     G. Age:  33w 5d         38  %    CI:        73.38   %    70 - 86                                                          FL/HC:      20.9   %    19.4 - 21.8  HC:      310.5  mm     G. Age:  34w 5d         31  %    HC/AC:      0.97        0.96 - 1.11  AC:      319.7  mm     G. Age:  35w 6d         94  %    FL/BPD:     77.7   %    71 - 87  FL:         65  mm     G. Age:  33w 4d         28  %    FL/AC:      20.3   %    20 - 24  Est. FW:    2547  gm    5 lb 10 oz      71  % ---------------------------------------------------------------------- OB History  Gravidity:    1         Term:   0        Prem:   0        SAB:   0  TOP:          0       Ectopic:  0        Living: 0 ---------------------------------------------------------------------- Gestational Age  LMP:           35w 0d        Date:  11/11/18                 EDD:   08/18/19  U/S Today:     34w 3d                                        EDD:  08/22/19  Best:          34w 0d     Det. ByMarcella Dubs         EDD:   08/25/19                                      (01/03/19) ---------------------------------------------------------------------- Anatomy  Cranium:                Previously seen        Aortic Arch:            Previously seen  Cavum:                 Previously seen        Ductal Arch:            Previously seen  Ventricles:            Appears normal         Diaphragm:              Appears normal  Choroid Plexus:        Previously seen        Stomach:                Appears normal, left                                                                        sided  Cerebellum:            Previously seen        Abdomen:                Previously seen  Posterior Fossa:       Previously seen        Abdominal Wall:         Previously seen  Nuchal Fold:           Not applicable (>20    Cord Vessels:           Previously seen                         wks GA)  Face:                  Orbits and profile     Kidneys:                Appear normal                         previously seen  Lips:                  Previously seen        Bladder:                Appears normal  Thoracic:              Previously seen        Spine:                  Previously seen  Heart:  Appears normal         Upper Extremities:      Previously seen                         (4CH, axis, and                         situs)  RVOT:                  Previously seen        Lower Extremities:      Previously seen  LVOT:                  Previously seen  Other:  Nasal bone previously seen.  Hands & Feet visualized previously.          Technically difficult due to maternal habitus and fetal position. ---------------------------------------------------------------------- Cervix Uterus Adnexa  Cervix  Not visualized (advanced GA >24wks) ---------------------------------------------------------------------- Comments  This patient was seen for a follow up growth scan due to  abnormal serum analytes noted on her quad screen.  She  denies any problems since her last exam.  She was informed that the fetal growth appears appropriate  for her gestational age.  Polyhydramnios with a total AFI of  over 28 cm was noted  today.  The patient reports that she  has screened negative for gestational diabetes.  A follow up exam was scheduled in 4 weeks. ----------------------------------------------------------------------                   Johnell Comings, MD Electronically Signed Final Report   07/14/2019 03:38 pm ----------------------------------------------------------------------

## 2019-08-12 NOTE — Progress Notes (Signed)
Patient ID: Michelle Gallagher, female   DOB: [birth year], 24 y.o.   MRN: [id number] [facility] ANTEPARTUM COMPREHENSIVE PROGRESS NOTE  Michelle Gallagher is a 24 y.o. G1P0000 at [redacted]w[redacted]d  who is admitted for COVID-19 pneumonia.   Fetal presentation is cephalic. Length of Stay:  3  Days  Subjective: Pt reports feeling better. Off O2. Denies SOB. Coughing has improved as well Patient reports good fetal movement.  She reports no uterine contractions, no bleeding and no loss of fluid per vagina.  Vitals:  Blood pressure 125/80, pulse 82, temperature 97.6 F (36.4 C), temperature source Oral, resp. rate (!) 36, height 5\' 5"  (1.651 m), weight 131.5 kg, last menstrual period 11/11/2018, SpO2 99 %.   Physical Examination: Lungs clear Heart RRR Abd soft + BS gravid Ext non tender  Fetal Monitoring:  Baseline: 140's bpm, accels and no decel  Labs:  Results for orders placed or performed during the hospital encounter of 08/09/19 (from the past 24 hour(s))  CBC with Differential/Platelet   Collection Time: 08/12/19  6:17 AM  Result Value Ref Range   WBC 10.6 (H) 4.0 - 10.5 K/uL   RBC 4.76 3.87 - 5.11 MIL/uL   Hemoglobin 11.0 (L) 12.0 - 15.0 g/dL   HCT 35.0 (L) 36.0 - 46.0 %   MCV 73.5 (L) 80.0 - 100.0 fL   MCH 23.1 (L) 26.0 - 34.0 pg   MCHC 31.4 30.0 - 36.0 g/dL   RDW 18.7 (H) 11.5 - 15.5 %   Platelets 165 150 - 400 K/uL   nRBC 0.9 (H) 0.0 - 0.2 %   Neutrophils Relative % 58 %   Neutro Abs 6.2 1.7 - 7.7 K/uL   Lymphocytes Relative 25 %   Lymphs Abs 2.6 0.7 - 4.0 K/uL   Monocytes Relative 4 %   Monocytes Absolute 0.4 0.1 - 1.0 K/uL   Eosinophils Relative 0 %   Eosinophils Absolute 0.0 0.0 - 0.5 K/uL   Basophils Relative 1 %   Basophils Absolute 0.1 0.0 - 0.1 K/uL   Immature Granulocytes 12 %   Abs Immature Granulocytes 1.30 (H) 0.00 - 0.07 K/uL  Comprehensive metabolic panel   Collection Time: 08/12/19  6:17 AM  Result Value Ref Range   Sodium 140 135 - 145 mmol/L   Potassium  4.0 3.5 - 5.1 mmol/L   Chloride 108 98 - 111 mmol/L   CO2 20 (L) 22 - 32 mmol/L   Glucose, Bld 94 70 - 99 mg/dL   BUN 9 6 - 20 mg/dL   Creatinine, Ser 0.88 0.44 - 1.00 mg/dL   Calcium 8.7 (L) 8.9 - 10.3 mg/dL   Total Protein 5.8 (L) 6.5 - 8.1 g/dL   Albumin 2.1 (L) 3.5 - 5.0 g/dL   AST 25 15 - 41 U/L   ALT 11 0 - 44 U/L   Alkaline Phosphatase 158 (H) 38 - 126 U/L   Total Bilirubin 0.6 0.3 - 1.2 mg/dL   GFR calc non Af Amer >60 >60 mL/min   GFR calc Af Amer >60 >60 mL/min   Anion gap 12 5 - 15  C-reactive protein   Collection Time: 08/12/19  6:17 AM  Result Value Ref Range   CRP 5.4 (H) <1.0 mg/dL  D-dimer, quantitative (not at [facility])   Collection Time: 08/12/19  6:17 AM  Result Value Ref Range   D-Dimer, Quant 0.87 (H) 0.00 - 0.50 ug/mL-FEU  Ferritin   Collection Time: 08/12/19  6:17 AM  Result Value Ref  Range   Ferritin 41 11 - 307 ng/mL    Imaging Studies:    NA   Medications:  Scheduled . vitamin C  500 mg Oral Daily  . dexamethasone (DECADRON) injection  6 mg Intravenous Q24H  . docusate sodium  100 mg Oral Daily  . enoxaparin (LOVENOX) injection  70 mg Subcutaneous Q24H  . Ipratropium-Albuterol  1 puff Inhalation QID  . prenatal multivitamin  1 tablet Oral Q1200  . sodium chloride flush  3 mL Intravenous Once  . zinc sulfate  220 mg Oral Daily   I have reviewed the patient's current medications.  ASSESSMENT: IUP 38 1/7 weeks COVID-19 pneumonia Polyhydramnios Abnormal AFP screen   PLAN: Stable from an OB standpoint. COVID-19 pneumonia as per TRH. Remdisivir day 3 of 5.  Continue routine antenatal care.   Hermina Staggers 08/12/2019,2:56 PM

## 2019-08-13 ENCOUNTER — Inpatient Hospital Stay (HOSPITAL_BASED_OUTPATIENT_CLINIC_OR_DEPARTMENT_OTHER): Payer: Medicaid Other

## 2019-08-13 DIAGNOSIS — Z3A38 38 weeks gestation of pregnancy: Secondary | ICD-10-CM

## 2019-08-13 DIAGNOSIS — O403XX0 Polyhydramnios, third trimester, not applicable or unspecified: Secondary | ICD-10-CM

## 2019-08-13 DIAGNOSIS — J9690 Respiratory failure, unspecified, unspecified whether with hypoxia or hypercapnia: Secondary | ICD-10-CM

## 2019-08-13 DIAGNOSIS — Z362 Encounter for other antenatal screening follow-up: Secondary | ICD-10-CM

## 2019-08-13 LAB — COMPREHENSIVE METABOLIC PANEL
ALT: 13 U/L (ref 0–44)
AST: 28 U/L (ref 15–41)
Albumin: 2 g/dL — ABNORMAL LOW (ref 3.5–5.0)
Alkaline Phosphatase: 163 U/L — ABNORMAL HIGH (ref 38–126)
Anion gap: 10 (ref 5–15)
BUN: 9 mg/dL (ref 6–20)
CO2: 20 mmol/L — ABNORMAL LOW (ref 22–32)
Calcium: 8.6 mg/dL — ABNORMAL LOW (ref 8.9–10.3)
Chloride: 109 mmol/L (ref 98–111)
Creatinine, Ser: 0.91 mg/dL (ref 0.44–1.00)
GFR calc Af Amer: >60 mL/min (ref >60)
GFR calc non Af Amer: >60 mL/min (ref >60)
Glucose, Bld: 91 mg/dL (ref 70–99)
Potassium: 3.7 mmol/L (ref 3.5–5.1)
Sodium: 139 mmol/L (ref 135–145)
Total Bilirubin: 0.6 mg/dL (ref 0.3–1.2)
Total Protein: 5.8 g/dL — ABNORMAL LOW (ref 6.5–8.1)

## 2019-08-13 LAB — CBC WITH DIFFERENTIAL/PLATELET
Abs Immature Granulocytes: 1.34 10*3/uL — ABNORMAL HIGH (ref 0.00–0.07)
Basophils Absolute: 0.1 10*3/uL (ref 0.0–0.1)
Basophils Relative: 1 %
Eosinophils Absolute: 0 10*3/uL (ref 0.0–0.5)
Eosinophils Relative: 0 %
HCT: 34.8 % — ABNORMAL LOW (ref 36.0–46.0)
Hemoglobin: 11.1 g/dL — ABNORMAL LOW (ref 12.0–15.0)
Immature Granulocytes: 13 %
Lymphocytes Relative: 28 %
Lymphs Abs: 2.9 10*3/uL (ref 0.7–4.0)
MCH: 23.3 pg — ABNORMAL LOW (ref 26.0–34.0)
MCHC: 31.9 g/dL (ref 30.0–36.0)
MCV: 73 fL — ABNORMAL LOW (ref 80.0–100.0)
Monocytes Absolute: 0.5 10*3/uL (ref 0.1–1.0)
Monocytes Relative: 5 %
Neutro Abs: 5.5 10*3/uL (ref 1.7–7.7)
Neutrophils Relative %: 53 %
Platelets: 178 10*3/uL (ref 150–400)
RBC: 4.77 MIL/uL (ref 3.87–5.11)
RDW: 18.9 % — ABNORMAL HIGH (ref 11.5–15.5)
WBC: 10.3 10*3/uL (ref 4.0–10.5)
nRBC: 3.2 % — ABNORMAL HIGH (ref 0.0–0.2)

## 2019-08-13 LAB — FERRITIN: Ferritin: 29 ng/mL (ref 11–307)

## 2019-08-13 LAB — D-DIMER, QUANTITATIVE: D-Dimer, Quant: 1.26 ug/mL-FEU — ABNORMAL HIGH (ref 0.00–0.50)

## 2019-08-13 LAB — C-REACTIVE PROTEIN: CRP: 3.2 mg/dL — ABNORMAL HIGH (ref <1.0)

## 2019-08-13 NOTE — Progress Notes (Signed)
PROGRESS NOTE                                                                                                                                                                                                             Patient Demographics:    Michelle Gallagher, is a 24 y.o. female, DOB - 07/14/1995, XBM:[id number]  Outpatient Primary MD for the patient is Patient, No Pcp Per   Admit date - 08/09/2019   LOS - 4  Chief Complaint  Patient presents with  . Emesis       Brief Narrative: Patient is a 24 y.o. female with remote history of bronchial asthma-[redacted] weeks pregnant-presenting with acute hypoxic respiratory failure secondary to COVID-19 pneumonia.  Significant Events: 5/4>> admit for hypoxia secondary to COVID-19  COVID-19 medications: Steroids: 5/4>> Remdesivir: 5/5>>  Antibiotics: None  Microbiology data: 5/4: Blood cultures>>no growth  DVT prophylaxis: SQ Lovenox  Procedures: None    Subjective:   Feels much better-no shortness of breath.  Claims he ambulated yesterday without any major issues.   Assessment  & Plan :   Acute Hypoxic Resp Failure due to Covid 19 Viral pneumonia: Significantly better-no oxygen for almost 48 hours-with O2 saturation in the high 90s.  CRP continues to downtrend-continue steroids and remdesivir-last day on 5/9.   Fever: afebrile  O2 requirements:  SpO2: 100 % O2 Flow Rate (L/min): 1 L/min   COVID-19 Labs: Recent Labs    08/11/19 0623 08/12/19 0617 08/13/19 0601  DDIMER 1.07* 0.87* 1.26*  FERRITIN 40 41 29  CRP 3.8* 5.4* 3.2*       Component Value Date/Time   BNP 41.7 08/09/2019 1830    Recent Labs  Lab 08/09/19 1830  PROCALCITON 0.15    Lab Results  Component Value Date   SARSCOV2NAA POSITIVE (A) 08/06/2019     Prone/Incentive Spirometry: encouraged incentive spirometry use 3-4/hour.  37 weeks term gestation: Per primary service.  ABG: No  results found for: PHART, PCO2ART, PO2ART, HCO3, TCO2, ACIDBASEDEF, O2SAT  Vent Settings: N/A    Condition - Stable  Family Communication  : Patient wants to update family herself.  I have asked her to let me know if they have any Covid related pneumonia questions.  Code Status :  Full Code  Diet :  Diet Order  Diet regular Room service appropriate? Yes; Fluid consistency: Thin  Diet effective now               Disposition Plan  : Per primary service.  Antimicorbials  :    Anti-infectives (From admission, onward)   Start     Dose/Rate Route Frequency Ordered Stop   08/11/19 1000  remdesivir 100 mg in sodium chloride 0.9 % 100 mL IVPB     100 mg 200 mL/hr over 30 Minutes Intravenous Daily 08/10/19 0916 08/15/19 0959   08/10/19 1030  remdesivir 100 mg in sodium chloride 0.9 % 100 mL IVPB     100 mg 200 mL/hr over 30 Minutes Intravenous  Once 08/10/19 0916 08/10/19 1146   08/10/19 1000  remdesivir 100 mg in sodium chloride 0.9 % 100 mL IVPB     100 mg 200 mL/hr over 30 Minutes Intravenous  Once 08/10/19 0916 08/10/19 1109   08/09/19 2200  nitrofurantoin (macrocrystal-monohydrate) (MACROBID) capsule 100 mg  Status:  Discontinued     100 mg Oral Every 12 hours 08/09/19 1750 08/09/19 1753   08/09/19 1800  fluconazole (DIFLUCAN) tablet 150 mg     150 mg Oral  Once 08/09/19 1750 08/09/19 1954      Inpatient Medications  Scheduled Meds: . vitamin C  500 mg Oral Daily  . dexamethasone (DECADRON) injection  6 mg Intravenous Q24H  . docusate sodium  100 mg Oral Daily  . enoxaparin (LOVENOX) injection  70 mg Subcutaneous Q24H  . Ipratropium-Albuterol  1 puff Inhalation QID  . prenatal multivitamin  1 tablet Oral Q1200  . sodium chloride flush  3 mL Intravenous Once  . zinc sulfate  220 mg Oral Daily   Continuous Infusions: . remdesivir 100 mg in NS 100 mL 100 mg (08/13/19 1001)   PRN Meds:.acetaminophen, calcium carbonate, chlorpheniramine-HYDROcodone,  guaiFENesin-dextromethorphan, ondansetron (ZOFRAN) IV, zolpidem   Time Spent in minutes  15  See all Orders from today for further details   Jeoffrey Massed M.D on 08/13/2019 at 1:33 PM  To page go to [email or web address] - use universal password  [facility] -  Office  [phone]    Objective:   Vitals:   08/13/19 0600 08/13/19 0700 08/13/19 0806 08/13/19 1114  BP:   121/75 111/71  Pulse:   78 72  Resp:   (!) 22 20  Temp:   97.7 F (36.5 C) (!) 97.1 F (36.2 C)  TempSrc:   Oral Oral  SpO2: 97% 100% 98% 100%  Weight:      Height:        Wt Readings from Last 3 Encounters:  08/09/19 131.5 kg  08/03/19 131.6 kg  07/28/19 133.4 kg     Intake/Output Summary (Last 24 hours) at 08/13/2019 1333 Last data filed at 08/13/2019 1001 Gross per 24 hour  Intake 2725 ml  Output 300 ml  Net 2425 ml     Physical Exam Gen Exam:Alert awake-not in any distress HEENT:atraumatic, normocephalic Chest: B/L clear to auscultation anteriorly CVS:S1S2 regular Abdomen:soft non tender Extremities:no edema Neurology: Non focal Skin: no rash   Data Review:    CBC Recent Labs  Lab 08/09/19 1830 08/10/19 0555 08/11/19 0623 08/12/19 0617 08/13/19 0601  WBC 6.1 6.6 9.3 10.6* 10.3  HGB 12.0 10.6* 10.7* 11.0* 11.1*  HCT 37.0 34.0* 33.4* 35.0* 34.8*  PLT 170 163 152 165 178  MCV 73.9* 74.9* 73.7* 73.5* 73.0*  MCH 24.0* 23.3* 23.6* 23.1* 23.3*  MCHC 32.4 31.2 32.0 31.4 31.9  RDW 18.4* 18.6* 18.7* 18.7* 18.9*  LYMPHSABS 1.7 0.3* 0.4* 2.6 2.9  MONOABS 0.2 0.1 0.2 0.4 0.5  EOSABS 0.0 0.0 0.0 0.0 0.0  BASOSABS 0.0 0.0 0.0 0.1 0.1    Chemistries  Recent Labs  Lab 08/09/19 1830 08/10/19 0555 08/11/19 0623 08/12/19 0617 08/13/19 0601  NA 136 135 136 140 139  K 3.9 4.4 3.9 4.0 3.7  CL 107 106 106 108 109  CO2 16* 15* 17* 20* 20*  GLUCOSE 79 88 105* 94 91  BUN 5* 7 8 9 9   CREATININE 0.96 0.99 0.84 0.88 0.91  CALCIUM 8.7* 8.9 8.8* 8.7* 8.6*  MG  --  2.0  --   --   --   AST  21 20 22 25 28   ALT 10 10 8 11 13   ALKPHOS 173* 168* 160* 158* 163*  BILITOT 0.8 0.8 0.6 0.6 0.6   ------------------------------------------------------------------------------------------------------------------ No results for input(s): CHOL, HDL, LDLCALC, TRIG, CHOLHDL, LDLDIRECT in the last 72 hours.  Lab Results  Component Value Date   HGBA1C 5.6 02/04/2019   ------------------------------------------------------------------------------------------------------------------ No results for input(s): TSH, T4TOTAL, T3FREE, THYROIDAB in the last 72 hours.  Invalid input(s): FREET3 ------------------------------------------------------------------------------------------------------------------ Recent Labs    08/12/19 0617 08/13/19 0601  FERRITIN 41 29    Coagulation profile No results for input(s): INR, PROTIME in the last 168 hours.  Recent Labs    08/12/19 0617 08/13/19 0601  DDIMER 0.87* 1.26*    Cardiac Enzymes No results for input(s): CKMB, TROPONINI, MYOGLOBIN in the last 168 hours.  Invalid input(s): CK ------------------------------------------------------------------------------------------------------------------    Component Value Date/Time   BNP 41.7 08/09/2019 1830    Micro Results Recent Results (from the past 240 hour(s))  Respiratory Panel by RT PCR (Flu A&B, Covid) - Nasopharyngeal Swab     Status: Abnormal   Collection Time: 08/06/19  9:46 PM   Specimen: Nasopharyngeal Swab  Result Value Ref Range Status   SARS Coronavirus 2 by RT PCR POSITIVE (A) NEGATIVE Final    Comment: RESULT CALLED TO, READ BACK BY AND VERIFIED WITH: RN G BELLAMY @0017  08/07/19 BY S GEZAHEGN (NOTE) SARS-CoV-2 target nucleic acids are DETECTED. SARS-CoV-2 RNA is generally detectable in upper respiratory specimens  during the acute phase of infection. Positive results are indicative of the presence of the identified virus, but do not rule out bacterial infection or  co-infection with other pathogens not detected by the test. Clinical correlation with patient history and other diagnostic information is necessary to determine patient infection status. The expected result is Negative. Fact Sheet for Patients:  10/09/2019 Fact Sheet for Healthcare Providers: 10/06/19 This test is not yet approved or cleared by the FDA and  has been authorized for detection and/or diagnosis of SARS-CoV-2 by FDA under an Emergency Use Authorization (EUA).  This EUA will remain in effect (meaning this test can be use d) for the duration of  the COVID-19 declaration under Section 564(b)(1) of the Act, 21 U.S.C. section 360bbb-3(b)(1), unless the authorization is terminated or revoked sooner.    Influenza A by PCR NEGATIVE NEGATIVE Final   Influenza B by PCR NEGATIVE NEGATIVE Final    Comment: (NOTE) The Xpert Xpress SARS-CoV-2/FLU/RSV assay is intended as an aid in  the diagnosis of influenza from Nasopharyngeal swab specimens and  should not be used as a sole basis for treatment. Nasal washings and  aspirates are unacceptable for Xpert Xpress SARS-CoV-2/FLU/RSV  testing. Fact Sheet for Patients: 10/07/19 Fact Sheet for Healthcare Providers: https://www.moore.com/  This test is not yet approved or cleared by the [place] and  has been authorized for detection and/or diagnosis of SARS-CoV-2 by  FDA under an Emergency Use Authorization (EUA). This EUA will remain  in effect (meaning this test can be used) for the duration of the  Covid-19 declaration under Section 564(b)(1) of the Act, 21  U.S.C. section 360bbb-3(b)(1), unless the authorization is  terminated or revoked. Performed at [facility], [address], [place], [place] [address]   Culture, [place] Urine     Status: Abnormal   Collection Time: 08/09/19  3:30 PM    Specimen: Urine, Random  Result Value Ref Range Status   Specimen Description URINE, RANDOM  Final   Special Requests NONE  Final   Culture (A)  Final    MULTIPLE SPECIES PRESENT, SUGGEST RECOLLECTION NO GROUP B STREP (S.AGALACTIAE) ISOLATED Performed at [facility], [address], [place], [place] [address]    Report Status 08/11/2019 FINAL  Final  Culture, blood (Routine X 2) w Reflex to ID Panel     Status: None (Preliminary result)   Collection Time: 08/09/19  6:30 PM   Specimen: BLOOD  Result Value Ref Range Status   Specimen Description BLOOD LEFT ANTECUBITAL  Final   Special Requests   Final    BOTTLES DRAWN AEROBIC AND ANAEROBIC Blood Culture adequate volume   Culture   Final    NO GROWTH 4 DAYS Performed at [facility], [address], [place], [place] [address]    Report Status PENDING  Incomplete  Culture, blood (Routine X 2) w Reflex to ID Panel     Status: None (Preliminary result)   Collection Time: 08/09/19  6:45 PM   Specimen: BLOOD  Result Value Ref Range Status   Specimen Description BLOOD LEFT ANTECUBITAL  Final   Special Requests   Final    BOTTLES DRAWN AEROBIC ONLY Blood Culture results may not be optimal due to an excessive volume of blood received in culture bottles   Culture   Final    NO GROWTH 4 DAYS Performed at [facility], [address] [place], [place] [address]    Report Status PENDING  Incomplete    Radiology Reports DG Chest 1 View  Result Date: 08/09/2019 CLINICAL DATA:  Tachypnea. COVID-19 positive. EXAM: CHEST  1 VIEW COMPARISON:  11/17/2010 FINDINGS: There is scattered bilateral airspace opacities. There is no pneumothorax. No large pleural effusion. The heart size is normal. The lung volumes are somewhat low. There is no acute osseous abnormality. IMPRESSION: Multifocal airspace opacities concerning for multifocal pneumonia (viral or bacterial). Electronically Signed   By: Katherine Mantle M.D.   On: 08/09/2019  17:03   [place] MFM OB FOLLOW UP  Result Date: 07/14/2019 ----------------------------------------------------------------------  OBSTETRICS REPORT                       (Signed Final 07/14/2019 03:38 pm) ---------------------------------------------------------------------- Patient Info  ID #:       [id number]                          D.O.B.:  Oct 06, 1995 (23 yrs)  Name:       Michelle Gallagher              Visit Date: 07/14/2019 02:33 pm ---------------------------------------------------------------------- Performed By  Performed By:     Lenise Arena  Ref. Address:     [address] [place]                                                             [place], [place]  Attending:        Johnell Comings MD         [facility]  Referred By:      [facility] ---------------------------------------------------------------------- Orders   #  Description                          Code         Ordered By   1  [place] MFM OB FOLLOW UP                  41660.63     Peterson Ao  ----------------------------------------------------------------------   #  Order #                    Accession #                 Episode #   1  [id number]                  [id number]                  [id number]  ---------------------------------------------------------------------- Indications   Polyhydramnios, third trimester, antepartum    O40.3XX0   condition or complication, unspecified fetus   Obesity complicating pregnancy, third          O99.213   trimester   Abnormal biochemical screen (AFP positive)     O28.9   Encounter for other antenatal screening        Z36.2   follow-up (low risk NIPS, 3.3FF)   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 Cord Insertion:      Previously Visualized  Amniotic Fluid  AFI FV:      Within normal limits  AFI Sum(cm)     %Tile       Largest Pocket(cm)  28.96           > 97  8.75  RUQ(cm)       RLQ(cm)       LUQ(cm)        LLQ(cm)  7.87          5.96          8.75           6.38  Comment:    Good fetal moevment, tone, and breathing ---------------------------------------------------------------------- Biometry  BPD:      83.7  mm     G. Age:  33w 5d         38  %    CI:        73.38   %    70 - 86                                                          FL/HC:      20.9   %    19.4 - 21.8  HC:      310.5  mm     G. Age:  34w 5d         31  %    HC/AC:      0.97        0.96 - 1.11  AC:      319.7  mm     G. Age:  35w 6d         94  %    FL/BPD:     77.7   %    71 - 87  FL:         65  mm     G. Age:  33w 4d         28  %    FL/AC:      20.3   %    20 - 24  Est. FW:    2547  gm    5 lb 10 oz      71  % ---------------------------------------------------------------------- OB History  Gravidity:    1         Term:   0        Prem:   0        SAB:   0  TOP:          0       Ectopic:  0        Living: 0 ---------------------------------------------------------------------- Gestational Age  LMP:           35w 0d        Date:  11/11/18                 EDD:   08/18/19  U/S Today:     34w 3d                                        EDD:   08/22/19  Best:          34w 0d     Det. ByMarcella Dubs         EDD:   08/25/19                                      (  01/03/19) ---------------------------------------------------------------------- Anatomy  Cranium:               Previously seen        Aortic Arch:            Previously seen  Cavum:                 Previously seen        Ductal Arch:            Previously seen  Ventricles:            Appears normal         Diaphragm:              Appears normal   Choroid Plexus:        Previously seen        Stomach:                Appears normal, left                                                                        sided  Cerebellum:            Previously seen        Abdomen:                Previously seen  Posterior Fossa:       Previously seen        Abdominal Wall:         Previously seen  Nuchal Fold:           Not applicable (>20    Cord Vessels:           Previously seen                         wks GA)  Face:                  Orbits and profile     Kidneys:                Appear normal                         previously seen  Lips:                  Previously seen        Bladder:                Appears normal  Thoracic:              Previously seen        Spine:                  Previously seen  Heart:                 Appears normal         Upper Extremities:      Previously seen                         (4CH, axis, and  situs)  RVOT:                  Previously seen        Lower Extremities:      Previously seen  LVOT:                  Previously seen  Other:  Nasal bone previously seen.  Hands & Feet visualized previously.          Technically difficult due to maternal habitus and fetal position. ---------------------------------------------------------------------- Cervix Uterus Adnexa  Cervix  Not visualized (advanced GA >24wks) ---------------------------------------------------------------------- Comments  This patient was seen for a follow up growth scan due to  abnormal serum analytes noted on her quad screen.  She  denies any problems since her last exam.  She was informed that the fetal growth appears appropriate  for her gestational age.  Polyhydramnios with a total AFI of  over 28 cm was noted today.  The patient reports that she  has screened negative for gestational diabetes.  A follow up exam was scheduled in 4 weeks. ----------------------------------------------------------------------                   Ma RingsVictor Fang, MD  Electronically Signed Final Report   07/14/2019 03:38 pm ----------------------------------------------------------------------

## 2019-08-13 NOTE — Progress Notes (Signed)
Patient had her routine BID NST with no complaints, however in over 1 hour monitoring had no 15x15 accelerations, one deceleration which appeared late in shape, however without visualized contraction.  Due to early term status and new onset of nonreactive testing, will proceed with labor induction.  Patient is currently significant improved with treatment of her COVID pneumonia and is maintaining her saturations on room air.  Plan discussed and patient is agreeable.  Will proceed when L&D is ready for patient.

## 2019-08-13 NOTE — Progress Notes (Signed)
Patient moved to L&D and monitored on L&D from 1416 to present.  Tracing has now been reactive with 15x15 accels and no decels for the entire period on L&D.  Discussed with Dr. Grace Bushy, MFM, who agrees with plan of return to [facility] specialty undelivered as previous concern has resolved.  Recommends fluid check and extra NST this evening.  Will obtain AFI prior to transfer back to [facility] specialty and continue monitoring until that is completed.  Discussed plan with patient and all questions answered.  Catalina Pizza, MD 08/13/2019 3:16 PM

## 2019-08-13 NOTE — Progress Notes (Signed)
Patient ID: Michelle Gallagher, female   DOB: 04/07/1996, 24 y.o.   MRN: [id number] FACULTY PRACTICE ANTEPARTUM(COMPREHENSIVE) NOTE  Michelle Gallagher is a 24 y.o. G1P0000 with Estimated Date of Delivery: 08/25/19   By   [redacted]w[redacted]d  who is admitted for respiraotry decompensation from [place].    Fetal presentation is cephalic. Length of Stay:  4  Days  Date of admission:08/09/2019  Subjective: No SOB only with showering Patient reports the fetal movement as active. Patient reports uterine contraction  activity as none. Patient reports  vaginal bleeding as none. Patient describes fluid per vagina as None.  Vitals:  Blood pressure 111/66, pulse 69, temperature 97.8 F (36.6 C), temperature source Oral, resp. rate (!) 28, height 5\' 5"  (1.651 m), weight 131.5 kg, last menstrual period 11/11/2018, SpO2 97 %. Vitals:   08/13/19 0200 08/13/19 0300 08/13/19 0454 08/13/19 0500  BP:   111/66   Pulse:   69   Resp:   (!) 28   Temp:   97.8 F (36.6 C)   TempSrc:   Oral   SpO2: 96% 95% 98% 97%  Weight:      Height:       Physical Examination:  General appearance - alert, well appearing, and in no distress Abdomen - soft, nontender, nondistended, no masses or organomegaly Fundal Height:  size equals dates Pelvic Exam:  examination not indicated Cervical Exam: Not evaluated.. Extremities: extremities normal, atraumatic, no cyanosis or edema with DTRs 2+ bilaterally Membranes:intact  Fetal Monitoring:  Baseline: 145 bpm, Variability: Good {> 6 bpm), Accelerations: Reactive and Decelerations: Absent   reactive  Labs:  Results for orders placed or performed during the hospital encounter of 08/09/19 (from the past 24 hour(s))  D-dimer, quantitative (not at [facility])   Collection Time: 08/13/19  6:01 AM  Result Value Ref Range   D-Dimer, Quant 1.26 (H) 0.00 - 0.50 ug/mL-FEU    Imaging Studies:    DG Chest 1 View  Result Date: 08/09/2019 CLINICAL DATA:  Tachypnea. COVID-19 positive. EXAM: CHEST  1 VIEW  COMPARISON:  11/17/2010 FINDINGS: There is scattered bilateral airspace opacities. There is no pneumothorax. No large pleural effusion. The heart size is normal. The lung volumes are somewhat low. There is no acute osseous abnormality. IMPRESSION: Multifocal airspace opacities concerning for multifocal pneumonia (viral or bacterial). Electronically Signed   By: Constance Holster M.D.   On: 08/09/2019 17:03     Medications:  Scheduled . vitamin C  500 mg Oral Daily  . dexamethasone (DECADRON) injection  6 mg Intravenous Q24H  . docusate sodium  100 mg Oral Daily  . enoxaparin (LOVENOX) injection  70 mg Subcutaneous Q24H  . Ipratropium-Albuterol  1 puff Inhalation QID  . prenatal multivitamin  1 tablet Oral Q1200  . sodium chloride flush  3 mL Intravenous Once  . zinc sulfate  220 mg Oral Daily   I have reviewed the patient's current medications.  ASSESSMENT: G1P0000 107w2d Estimated Date of Delivery: 08/25/19  COVID 19 infection with pneumonia and respiratory failure, improved Patient Active Problem List   Diagnosis Date Noted  . Pneumonia due to COVID-19 virus 08/09/2019  . COVID-19 infection affecting pregnancy in third trimester 08/07/2019  . Polyhydramnios affecting pregnancy 08/03/2019  . Abnormal antenatal AFP screen 03/15/2019  . Maternal morbid obesity, antepartum ([place]) 02/04/2019  . Supervision of high-risk pregnancy 01/24/2019    PLAN: Finish course of remdesivir which should be tomorrow am, continue decadron Anticipate discharge home at that time if coursae continues to  improve  Lazaro Arms 08/13/2019,7:13 AM

## 2019-08-14 LAB — COMPREHENSIVE METABOLIC PANEL
ALT: 15 U/L (ref 0–44)
AST: 29 U/L (ref 15–41)
Albumin: 2.1 g/dL — ABNORMAL LOW (ref 3.5–5.0)
Alkaline Phosphatase: 161 U/L — ABNORMAL HIGH (ref 38–126)
Anion gap: 9 (ref 5–15)
BUN: 10 mg/dL (ref 6–20)
CO2: 21 mmol/L — ABNORMAL LOW (ref 22–32)
Calcium: 8.7 mg/dL — ABNORMAL LOW (ref 8.9–10.3)
Chloride: 109 mmol/L (ref 98–111)
Creatinine, Ser: 0.96 mg/dL (ref 0.44–1.00)
GFR calc Af Amer: >60 mL/min (ref >60)
GFR calc non Af Amer: >60 mL/min (ref >60)
Glucose, Bld: 85 mg/dL (ref 70–99)
Potassium: 3.8 mmol/L (ref 3.5–5.1)
Sodium: 139 mmol/L (ref 135–145)
Total Bilirubin: 0.6 mg/dL (ref 0.3–1.2)
Total Protein: 6 g/dL — ABNORMAL LOW (ref 6.5–8.1)

## 2019-08-14 LAB — CBC WITH DIFFERENTIAL/PLATELET
Abs Immature Granulocytes: 1.28 10*3/uL — ABNORMAL HIGH (ref 0.00–0.07)
Basophils Absolute: 0.1 10*3/uL (ref 0.0–0.1)
Basophils Relative: 1 %
Eosinophils Absolute: 0 10*3/uL (ref 0.0–0.5)
Eosinophils Relative: 0 %
HCT: 36.4 % (ref 36.0–46.0)
Hemoglobin: 11.6 g/dL — ABNORMAL LOW (ref 12.0–15.0)
Immature Granulocytes: 12 %
Lymphocytes Relative: 28 %
Lymphs Abs: 2.9 10*3/uL (ref 0.7–4.0)
MCH: 23.2 pg — ABNORMAL LOW (ref 26.0–34.0)
MCHC: 31.9 g/dL (ref 30.0–36.0)
MCV: 72.9 fL — ABNORMAL LOW (ref 80.0–100.0)
Monocytes Absolute: 0.4 10*3/uL (ref 0.1–1.0)
Monocytes Relative: 4 %
Neutro Abs: 5.6 10*3/uL (ref 1.7–7.7)
Neutrophils Relative %: 55 %
Platelets: 197 10*3/uL (ref 150–400)
RBC: 4.99 MIL/uL (ref 3.87–5.11)
RDW: 18.9 % — ABNORMAL HIGH (ref 11.5–15.5)
WBC: 10.3 10*3/uL (ref 4.0–10.5)
nRBC: 5.1 % — ABNORMAL HIGH (ref 0.0–0.2)

## 2019-08-14 LAB — CULTURE, BLOOD (ROUTINE X 2)
Culture: NO GROWTH
Culture: NO GROWTH
Special Requests: ADEQUATE

## 2019-08-14 LAB — D-DIMER, QUANTITATIVE: D-Dimer, Quant: 1.68 ug/mL-FEU — ABNORMAL HIGH (ref 0.00–0.50)

## 2019-08-14 MED ORDER — DEXAMETHASONE 6 MG PO TABS
6.0000 mg | ORAL_TABLET | Freq: Every day | ORAL | 0 refills | Status: DC
Start: 2019-08-14 — End: 2019-08-19

## 2019-08-14 NOTE — Discharge Instructions (Signed)
10 Things You Can Do to Manage Your COVID-19 Symptoms at Home If you have possible or confirmed COVID-19: 1. Stay home from work and school. And stay away from other public places. If you must go out, avoid using any kind of public transportation, ridesharing, or taxis. 2. Monitor your symptoms carefully. If your symptoms get worse, call your healthcare provider immediately. 3. Get rest and stay hydrated. 4. If you have a medical appointment, call the healthcare provider ahead of time and tell them that you have or may have COVID-19. 5. For medical emergencies, call 911 and notify the dispatch personnel that you have or may have COVID-19. 6. Cover your cough and sneezes with a tissue or use the inside of your elbow. 7. Wash your hands often with soap and water for at least 20 seconds or clean your hands with an alcohol-based hand sanitizer that contains at least 60% alcohol. 8. As much as possible, stay in a specific room and away from other people in your home. Also, you should use a separate bathroom, if available. If you need to be around other people in or outside of the home, wear a mask. 9. Avoid sharing personal items with other people in your household, like dishes, towels, and bedding. 10. Clean all surfaces that are touched often, like counters, tabletops, and doorknobs. Use household cleaning sprays or wipes according to the label instructions. cdc.gov/coronavirus 10/06/2018 This information is not intended to replace advice given to you by your health care provider. Make sure you discuss any questions you have with your health care provider. Document Revised: 03/10/2019 Document Reviewed: 03/10/2019 Elsevier Patient Education  2020 [employer]  

## 2019-08-14 NOTE — Progress Notes (Signed)
Michelle Gallagher, is a 24 y.o. female, DOB - 08/07/1995, XBM:[id number]  Patient postpartum day 1, [facility], she has finished her remdesivir course and has been discharged on 5 more days of oral steroids with which I agree.  Currently on room air with 100% oxygenation.  Agree with Covid plan.   Vitals:   08/13/19 2004 08/13/19 2208 08/14/19 0759 08/14/19 1100  BP: 126/87 117/73 120/75 120/78  Pulse: 65 71 (!) 56 62  Resp: (!) 22 20 18 18   Temp: 98.3 F (36.8 C) 97.9 F (36.6 C) 97.6 F (36.4 C) 97.7 F (36.5 C)  TempSrc: Oral Oral Oral Oral  SpO2: 100% 98% 99% 100%  Weight:      Height:          Recent Labs  Lab 08/10/19 0555 08/11/19 0623 08/12/19 0617 08/13/19 0601 08/14/19 0836  WBC 6.6 9.3 10.6* 10.3 10.3  HGB 10.6* 10.7* 11.0* 11.1* 11.6*  HCT 34.0* 33.4* 35.0* 34.8* 36.4  PLT 163 152 165 178 197  MCV 74.9* 73.7* 73.5* 73.0* 72.9*  MCH 23.3* 23.6* 23.1* 23.3* 23.2*  MCHC 31.2 32.0 31.4 31.9 31.9  RDW 18.6* 18.7* 18.7* 18.9* 18.9*  LYMPHSABS 0.3* 0.4* 2.6 2.9 2.9  MONOABS 0.1 0.2 0.4 0.5 0.4  EOSABS 0.0 0.0 0.0 0.0 0.0  BASOSABS 0.0 0.0 0.1 0.1 0.1    Recent Labs  Lab 08/09/19 1830 08/09/19 1830 08/10/19 0555 08/11/19 0623 08/12/19 0617 08/13/19 0601 08/14/19 0836  NA 136   < > 135 136 140 139 139  K 3.9   < > 4.4 3.9 4.0 3.7 3.8  CL 107   < > 106 106 108 109 109  CO2 16*   < > 15* 17* 20* 20* 21*  GLUCOSE 79   < > 88 105* 94 91 85  BUN 5*   < > 7 8 9 9 10   CREATININE 0.96   < > 0.99 0.84 0.88 0.91 0.96  CALCIUM 8.7*   < > 8.9 8.8* 8.7* 8.6* 8.7*  AST 21   < > 20 22 25 28 29   ALT 10   < > 10 8 11 13 15   ALKPHOS 173*   < > 168* 160* 158* 163* 161*  BILITOT 0.8   < > 0.8 0.6 0.6 0.6 0.6  ALBUMIN 2.5*   < > 2.3* 2.1* 2.1* 2.0* 2.1*  MG  --   --  2.0  --   --   --   --   CRP 6.3*  --  5.9* 3.8* 5.4* 3.2*  --   DDIMER 2.78*   < > 2.08* 1.07*  0.87* 1.26* 1.68*  PROCALCITON 0.15  --   --   --   --   --   --   BNP 41.7  --   --   --   --   --   --    < > = values in this interval not displayed.       Data Review   Micro Results Recent Results (from the past 240 hour(s))  Respiratory Panel by RT PCR (Flu A&B, Covid) - Nasopharyngeal Swab     Status: Abnormal   Collection Time: 08/06/19  9:46 PM   Specimen: Nasopharyngeal Swab  Result Value Ref Range Status   SARS Coronavirus 2 by RT PCR POSITIVE (A) NEGATIVE Final    Comment: RESULT CALLED TO, READ BACK BY AND VERIFIED WITH: RN G BELLAMY @0017  08/07/19 BY  S GEZAHEGN (NOTE) SARS-CoV-2 target nucleic acids are DETECTED. SARS-CoV-2 RNA is generally detectable in upper respiratory specimens  during the acute phase of infection. Positive results are indicative of the presence of the identified virus, but do not rule out bacterial infection or co-infection with other pathogens not detected by the test. Clinical correlation with patient history and other diagnostic information is necessary to determine patient infection status. The expected result is Negative. Fact Sheet for Patients:  https://www.moore.com/ Fact Sheet for Healthcare Providers: https://www.young.biz/ This test is not yet approved or cleared by the [place] FDA and  has been authorized for detection and/or diagnosis of SARS-CoV-2 by FDA under an Emergency Use Authorization (EUA).  This EUA will remain in effect (meaning this test can be use d) for the duration of  the COVID-19 declaration under Section 564(b)(1) of the Act, 21 U.S.C. section 360bbb-3(b)(1), unless the authorization is terminated or revoked sooner.    Influenza A by PCR NEGATIVE NEGATIVE Final   Influenza B by PCR NEGATIVE NEGATIVE Final    Comment: (NOTE) The Xpert Xpress SARS-CoV-2/FLU/RSV assay is intended as an aid in  the diagnosis of influenza from Nasopharyngeal swab specimens and  should  not be used as a sole basis for treatment. Nasal washings and  aspirates are unacceptable for Xpert Xpress SARS-CoV-2/FLU/RSV  testing. Fact Sheet for Patients: https://www.moore.com/ Fact Sheet for Healthcare Providers: https://www.young.biz/ This test is not yet approved or cleared by the [place] FDA and  has been authorized for detection and/or diagnosis of SARS-CoV-2 by  FDA under an Emergency Use Authorization (EUA). This EUA will remain  in effect (meaning this test can be used) for the duration of the  Covid-19 declaration under Section 564(b)(1) of the Act, 21  U.S.C. section 360bbb-3(b)(1), unless the authorization is  terminated or revoked. Performed at [facility], [address], [place], [place] [address]   Culture, [place] Urine     Status: Abnormal   Collection Time: 08/09/19  3:30 PM   Specimen: Urine, Random  Result Value Ref Range Status   Specimen Description URINE, RANDOM  Final   Special Requests NONE  Final   Culture (A)  Final    MULTIPLE SPECIES PRESENT, SUGGEST RECOLLECTION NO GROUP B STREP (S.AGALACTIAE) ISOLATED Performed at [facility], [address] [place], [place] [address]    Report Status 08/11/2019 FINAL  Final  Culture, blood (Routine X 2) w Reflex to ID Panel     Status: None   Collection Time: 08/09/19  6:30 PM   Specimen: BLOOD  Result Value Ref Range Status   Specimen Description BLOOD LEFT ANTECUBITAL  Final   Special Requests   Final    BOTTLES DRAWN AEROBIC AND ANAEROBIC Blood Culture adequate volume   Culture   Final    NO GROWTH 5 DAYS Performed at [facility], [address], [place], [place] [address]    Report Status 08/14/2019 FINAL  Final  Culture, blood (Routine X 2) w Reflex to ID Panel     Status: None   Collection Time: 08/09/19  6:45 PM   Specimen: BLOOD  Result Value Ref Range Status   Specimen Description BLOOD LEFT ANTECUBITAL  Final   Special Requests    Final    BOTTLES DRAWN AEROBIC ONLY Blood Culture results may not be optimal due to an excessive volume of blood received in culture bottles   Culture   Final    NO GROWTH 5 DAYS  Performed at [facility], [address], [place], [place] [address]    Report Status 08/14/2019 FINAL  Final    Radiology Reports DG Chest 1 View  Result Date: 08/09/2019 CLINICAL DATA:  Tachypnea. COVID-19 positive. EXAM: CHEST  1 VIEW COMPARISON:  11/17/2010 FINDINGS: There is scattered bilateral airspace opacities. There is no pneumothorax. No large pleural effusion. The heart size is normal. The lung volumes are somewhat low. There is no acute osseous abnormality. IMPRESSION: Multifocal airspace opacities concerning for multifocal pneumonia (viral or bacterial). Electronically Signed   By: Katherine Mantle M.D.   On: 08/09/2019 17:03   [place] MFM OB FOLLOW UP  Result Date: 08/14/2019 ----------------------------------------------------------------------  OBSTETRICS REPORT                       (Signed Final 08/14/2019 12:28 pm) ---------------------------------------------------------------------- Patient Info  ID #:       [id number]                          D.O.B.:  [birth year] (23 yrs)  Name:       Michelle Gallagher              Visit Date: 08/13/2019 04:36 pm ---------------------------------------------------------------------- Performed By  Attending:        Lin Landsman      Ref. Address:      [address]                    [place]                                                              [place], [place]  Performed By:     Birdena Crandall        Secondary Phy.:    [employer]                    RDMS,RVTNO  Referred By:      Everardo All       [facility] ---------------------------------------------------------------------- Orders  #  Description                           Code        Ordered By  1  [place]  MFM OB FOLLOW UP                   19147.82    [employer] ----------------------------------------------------------------------  #  Order #                     Accession #                Episode #  1  [id number]  [id number]                 [id number] ---------------------------------------------------------------------- Indications  Polyhydramnios, third trimester, antepartum     O40.3XX0  condition or complication, unspecified fetus  Obesity complicating pregnancy, third           O99.213  trimester  Abnormal biochemical screen (AFP positive)      O28.9  Encounter for other antenatal screening         Z36.2  follow-up (low risk NIPS, 3.3FF)  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 Cord Insertion:       Previously Visualized  Amniotic Fluid  AFI FV:      Within normal limits  AFI Sum(cm)     %Tile       Largest Pocket(cm)  17.7            70          6.6  RUQ(cm)       RLQ(cm)       LUQ(cm)        LLQ(cm)  6.6           4.1           4.3            2.7 ---------------------------------------------------------------------- Biometry  BPD:      90.6  mm     G. Age:  36w 5d         33  %    CI:        76.62   %    70 - 86                                                          FL/HC:       22.8  %    20.9 - 22.7  HC:      327.9  mm     G. Age:  37w 2d         12  %    HC/AC:       0.93       0.92 - 1.05  AC:      352.1  mm     G. Age:  39w 1d         87  %    FL/BPD:      82.5  %    71 - 87  FL:       74.7  mm     G. Age:  38w 1d         52  %    FL/AC:       21.2  %    20 -  24  Est. FW:    3479   gm   7 lb 11 oz      67  %  ---------------------------------------------------------------------- OB History  Gravidity:    1         Term:   0        Prem:   0        SAB:   0  TOP:          0       Ectopic:  0        Living: 0 ---------------------------------------------------------------------- Gestational Age  LMP:           39w 2d        Date:  11/11/18                 EDD:   08/18/19  U/S Today:     37w 6d                                        EDD:   08/28/19  Best:          38w 2d     Det. ByMarcella Dubs         EDD:   08/25/19                                      (01/03/19) ---------------------------------------------------------------------- Anatomy  Cranium:               Previously seen        Aortic Arch:            Previously seen  Cavum:                 Previously seen        Ductal Arch:            Previously seen  Ventricles:            Appears normal         Diaphragm:              Previously seen  Choroid Plexus:        Previously seen        Stomach:                Appears normal, left                                                                        sided  Cerebellum:            Previously seen        Abdomen:                Previously seen  Posterior Fossa:       Previously seen        Abdominal Wall:         Previously seen  Nuchal Fold:           Not applicable (>20    Cord Vessels:  Previously seen                         wks GA)  Face:                  Orbits and profile     Kidneys:                Appear normal                         previously seen  Lips:                  Previously seen        Bladder:                Appears normal  Thoracic:              Previously seen        Spine:                  Previously seen  Heart:                 Previously seen        Upper Extremities:      Previously seen  RVOT:                  Previously seen        Lower Extremities:      Previously seen  LVOT:                  Previously seen  Other:  Nasal bone previously seen.  Hands & Feet visualized  previously.          Technically difficult due to maternal habitus, fetal position and  due to          advanced gestational age. ---------------------------------------------------------------------- Impression  Follow up growth  Normal interval growth, with good fetal movement and  amniotic fluid ---------------------------------------------------------------------- Recommendations  Follow up as clinically indicated. ----------------------------------------------------------------------               Lin Landsman, MD Electronically Signed Final Report   08/14/2019 12:28 pm ----------------------------------------------------------------------   Signature  Susa Raring M.D on 08/14/2019 at 3:20 PM   -  To page go to [email or web address]

## 2019-08-14 NOTE — Progress Notes (Signed)
Pt discharged after d/c instructions given. All questions answered. IV discontinued. Pt discharged via wheelchair with all belongings. Pt in stable condition.

## 2019-08-14 NOTE — Discharge Summary (Addendum)
Antenatal Physician Discharge Summary  Patient ID: Michelle Gallagher MRN: [id number] DOB/AGE: January 06, 1996 23 y.o.  Admit date: 08/09/2019 Discharge date: 08/14/2019  Admission Diagnoses: Principal Problem:   COVID-19 infection affecting pregnancy in third trimester Active Problems:   Supervision of high-risk pregnancy   Maternal morbid obesity, antepartum (HCC)   Abnormal antenatal AFP screen   Polyhydramnios affecting pregnancy   Pneumonia due to COVID-19 virus  Discharge Diagnoses: Same  Prenatal Procedures: none  Consults: Neonatology, Maternal Fetal Medicine , [facility] Course:  Katerra D Droge is a 24 y.o. G1P0000 with IUP at [redacted]w[redacted]d admitted for COVID pneumonia. She was co-managed with the hospitalist. She was started on Remdesivir and Decadron due to oxygen requirement and abnormal CXR. Inflammatory markers were trended. Her oxygen requirement decreased and she completed her Remdesivir and was deemed stable for discharge. She was seen by Neonatology and Maternal Fetal Care during her stay.  She was observed, fetal heart rate monitoring remained reassuring. She was deemed stable for discharge to home with outpatient follow up.  Discharge Exam: Temp:  [97.1 F (36.2 C)-98.5 F (36.9 C)] 97.6 F (36.4 C) (05/09 0759) Pulse Rate:  [56-87] 56 (05/09 0759) Resp:  [18-22] 18 (05/09 0759) BP: (108-126)/(56-87) 120/75 (05/09 0759) SpO2:  [98 %-100 %] 99 % (05/09 0759) Physical Examination: CONSTITUTIONAL: Well-developed, well-nourished female in no acute distress.  HENT:  Normocephalic, atraumatic, External right and left ear normal. Oropharynx is clear and moist EYES: Conjunctivae are normal.  No scleral icterus.  NECK: Normal range of motion, supple, no masses SKIN: Skin is warm and dry. No rash noted. Not diaphoretic. No erythema. No pallor. NEUROLGIC: Alert and oriented to person, place, and time.  PSYCHIATRIC: Normal mood and affect. Normal behavior. Normal  judgment and thought content. CARDIOVASCULAR: Normal heart rate noted, regular rhythm RESPIRATORY: Effort and breath sounds normal, no problems with respiration noted MUSCULOSKELETAL: Normal range of motion. No edema and no tenderness. 2+ distal pulses. ABDOMEN: Soft, nontender, nondistended, gravid. CERVIX:    Fetal monitoring: FHR: 130 bpm, Variability: moderate, Accelerations: Present, Decelerations: Absent   Significant Diagnostic Studies:  Results for orders placed or performed during the hospital encounter of 08/09/19 (from the past 168 hour(s))  Lipase, blood   Collection Time: 08/09/19 12:50 PM  Result Value Ref Range   Lipase 33 11 - 51 U/L  Comprehensive metabolic panel   Collection Time: 08/09/19 12:50 PM  Result Value Ref Range   Sodium 135 135 - 145 mmol/L   Potassium 3.8 3.5 - 5.1 mmol/L   Chloride 106 98 - 111 mmol/L   CO2 16 (L) 22 - 32 mmol/L   Glucose, Bld 83 70 - 99 mg/dL   BUN <5 (L) 6 - 20 mg/dL   Creatinine, Ser 0.35 0.44 - 1.00 mg/dL   Calcium 8.7 (L) 8.9 - 10.3 mg/dL   Total Protein 6.4 (L) 6.5 - 8.1 g/dL   Albumin 2.5 (L) 3.5 - 5.0 g/dL   AST 20 15 - 41 U/L   ALT 9 0 - 44 U/L   Alkaline Phosphatase 173 (H) 38 - 126 U/L   Total Bilirubin 0.7 0.3 - 1.2 mg/dL   GFR calc non Af Amer >60 >60 mL/min   GFR calc Af Amer >60 >60 mL/min   Anion gap 13 5 - 15  CBC   Collection Time: 08/09/19 12:50 PM  Result Value Ref Range   WBC 7.1 4.0 - 10.5 K/uL   RBC 5.06 3.87 - 5.11 MIL/uL  Hemoglobin 11.7 (L) 12.0 - 15.0 g/dL   HCT 37.8 36.0 - 46.0 %   MCV 74.7 (L) 80.0 - 100.0 fL   MCH 23.1 (L) 26.0 - 34.0 pg   MCHC 31.0 30.0 - 36.0 g/dL   RDW 18.8 (H) 11.5 - 15.5 %   Platelets 162 150 - 400 K/uL   nRBC 0.0 0.0 - 0.2 %  I-Stat beta hCG blood, ED   Collection Time: 08/09/19 12:58 PM  Result Value Ref Range   I-stat hCG, quantitative >2,000.0 (H) <5 mIU/mL   Comment 3          Culture, OB Urine   Collection Time: 08/09/19  3:30 PM   Specimen: Urine, Random   Result Value Ref Range   Specimen Description URINE, RANDOM    Special Requests NONE    Culture (A)     MULTIPLE SPECIES PRESENT, SUGGEST RECOLLECTION NO GROUP B STREP (S.AGALACTIAE) ISOLATED Performed at [facility], [place] [address], [place], [place] [address]    Report Status 08/11/2019 FINAL   Urinalysis, Routine w reflex microscopic   Collection Time: 08/09/19  3:30 PM  Result Value Ref Range   Color, Urine AMBER (A) YELLOW   APPearance CLOUDY (A) CLEAR   Specific Gravity, Urine 1.028 1.005 - 1.030   pH 5.0 5.0 - 8.0   Glucose, UA NEGATIVE NEGATIVE mg/dL   Hgb urine dipstick NEGATIVE NEGATIVE   Bilirubin Urine NEGATIVE NEGATIVE   Ketones, ur 80 (A) NEGATIVE mg/dL   Protein, ur 100 (A) NEGATIVE mg/dL   Nitrite NEGATIVE NEGATIVE   Leukocytes,Ua NEGATIVE NEGATIVE   WBC, UA 11-20 0 - 5 WBC/hpf   Bacteria, UA MANY (A) NONE SEEN   Squamous Epithelial / LPF 21-50 0 - 5   Mucus PRESENT    Budding Yeast PRESENT   Culture, blood (Routine X 2) w Reflex to ID Panel   Collection Time: 08/09/19  6:30 PM   Specimen: BLOOD  Result Value Ref Range   Specimen Description BLOOD LEFT ANTECUBITAL    Special Requests      BOTTLES DRAWN AEROBIC AND ANAEROBIC Blood Culture adequate volume   Culture      NO GROWTH 5 DAYS Performed at [facility], [place] [address], [place], [place] [address]    Report Status 08/14/2019 FINAL   Brain natriuretic peptide   Collection Time: 08/09/19  6:30 PM  Result Value Ref Range   B Natriuretic Peptide 41.7 0.0 - 100.0 pg/mL  C-reactive protein   Collection Time: 08/09/19  6:30 PM  Result Value Ref Range   CRP 6.3 (H) <1.0 mg/dL  Comprehensive metabolic panel   Collection Time: 08/09/19  6:30 PM  Result Value Ref Range   Sodium 136 135 - 145 mmol/L   Potassium 3.9 3.5 - 5.1 mmol/L   Chloride 107 98 - 111 mmol/L   CO2 16 (L) 22 - 32 mmol/L   Glucose, Bld 79 70 - 99 mg/dL   BUN 5 (L) 6 - 20 mg/dL   Creatinine, Ser 0.96 0.44 - 1.00 mg/dL    Calcium 8.7 (L) 8.9 - 10.3 mg/dL   Total Protein 6.5 6.5 - 8.1 g/dL   Albumin 2.5 (L) 3.5 - 5.0 g/dL   AST 21 15 - 41 U/L   ALT 10 0 - 44 U/L   Alkaline Phosphatase 173 (H) 38 - 126 U/L   Total Bilirubin 0.8 0.3 - 1.2 mg/dL   GFR calc non Af Amer >60 >60 mL/min  GFR calc Af Amer >60 >60 mL/min   Anion gap 13 5 - 15  CBC with Differential/Platelet   Collection Time: 08/09/19  6:30 PM  Result Value Ref Range   WBC 6.1 4.0 - 10.5 K/uL   RBC 5.01 3.87 - 5.11 MIL/uL   Hemoglobin 12.0 12.0 - 15.0 g/dL   HCT 16.1 09.6 - 04.5 %   MCV 73.9 (L) 80.0 - 100.0 fL   MCH 24.0 (L) 26.0 - 34.0 pg   MCHC 32.4 30.0 - 36.0 g/dL   RDW 40.9 (H) 81.1 - 91.4 %   Platelets 170 150 - 400 K/uL   nRBC 0.0 0.0 - 0.2 %   Neutrophils Relative % 67 %   Neutro Abs 4.1 1.7 - 7.7 K/uL   Lymphocytes Relative 27 %   Lymphs Abs 1.7 0.7 - 4.0 K/uL   Monocytes Relative 4 %   Monocytes Absolute 0.2 0.1 - 1.0 K/uL   Eosinophils Relative 0 %   Eosinophils Absolute 0.0 0.0 - 0.5 K/uL   Basophils Relative 0 %   Basophils Absolute 0.0 0.0 - 0.1 K/uL   Immature Granulocytes 2 %   Abs Immature Granulocytes 0.13 (H) 0.00 - 0.07 K/uL  D-dimer, quantitative (not at [facility])   Collection Time: 08/09/19  6:30 PM  Result Value Ref Range   D-Dimer, Quant 2.78 (H) 0.00 - 0.50 ug/mL-FEU  Ferritin   Collection Time: 08/09/19  6:30 PM  Result Value Ref Range   Ferritin 33 11 - 307 ng/mL  Lactate dehydrogenase   Collection Time: 08/09/19  6:30 PM  Result Value Ref Range   LDH 191 98 - 192 U/L  Procalcitonin   Collection Time: 08/09/19  6:30 PM  Result Value Ref Range   Procalcitonin 0.15 ng/mL  Hepatitis B surface antigen   Collection Time: 08/09/19  6:30 PM  Result Value Ref Range   Hepatitis B Surface Ag NON REACTIVE NON REACTIVE  Type and screen [facility]   Collection Time: 08/09/19  6:30 PM  Result Value Ref Range   ABO/RH(D) O POS    Antibody Screen NEG    Sample Expiration       08/12/2019,2359 Performed at [facility], [address], [place], [place] [address]   Culture, blood (Routine X 2) w Reflex to ID Panel   Collection Time: 08/09/19  6:45 PM   Specimen: BLOOD  Result Value Ref Range   Specimen Description BLOOD LEFT ANTECUBITAL    Special Requests      BOTTLES DRAWN AEROBIC ONLY Blood Culture results may not be optimal due to an excessive volume of blood received in culture bottles   Culture      NO GROWTH 5 DAYS Performed at [facility], [address] [place], [place] [address]    Report Status 08/14/2019 FINAL   Lactic acid, plasma   Collection Time: 08/09/19  8:19 PM  Result Value Ref Range   Lactic Acid, Venous 1.1 0.5 - 1.9 mmol/L  CBC with Differential/Platelet   Collection Time: 08/10/19  5:55 AM  Result Value Ref Range   WBC 6.6 4.0 - 10.5 K/uL   RBC 4.54 3.87 - 5.11 MIL/uL   Hemoglobin 10.6 (L) 12.0 - 15.0 g/dL   HCT 86.5 (L) 78.4 - 69.6 %   MCV 74.9 (L) 80.0 - 100.0 fL   MCH 23.3 (L) 26.0 - 34.0 pg   MCHC 31.2 30.0 - 36.0 g/dL   RDW 29.5 (H) 28.4 -  15.5 %   Platelets 163 150 - 400 K/uL   nRBC 0.3 (H) 0.0 - 0.2 %   Neutrophils Relative % 92 %   Neutro Abs 6.1 1.7 - 7.7 K/uL   Lymphocytes Relative 5 %   Lymphs Abs 0.3 (L) 0.7 - 4.0 K/uL   Monocytes Relative 2 %   Monocytes Absolute 0.1 0.1 - 1.0 K/uL   Eosinophils Relative 0 %   Eosinophils Absolute 0.0 0.0 - 0.5 K/uL   Basophils Relative 0 %   Basophils Absolute 0.0 0.0 - 0.1 K/uL   nRBC 0 0 /100 WBC   Myelocytes 1 %   Abs Immature Granulocytes 0.10 (H) 0.00 - 0.07 K/uL  Comprehensive metabolic panel   Collection Time: 08/10/19  5:55 AM  Result Value Ref Range   Sodium 135 135 - 145 mmol/L   Potassium 4.4 3.5 - 5.1 mmol/L   Chloride 106 98 - 111 mmol/L   CO2 15 (L) 22 - 32 mmol/L   Glucose, Bld 88 70 - 99 mg/dL   BUN 7 6 - 20 mg/dL   Creatinine, Ser 1.61 0.44 - 1.00 mg/dL   Calcium 8.9 8.9 - 09.6 mg/dL   Total Protein 6.1 (L) 6.5 - 8.1 g/dL   Albumin  2.3 (L) 3.5 - 5.0 g/dL   AST 20 15 - 41 U/L   ALT 10 0 - 44 U/L   Alkaline Phosphatase 168 (H) 38 - 126 U/L   Total Bilirubin 0.8 0.3 - 1.2 mg/dL   GFR calc non Af Amer >60 >60 mL/min   GFR calc Af Amer >60 >60 mL/min   Anion gap 14 5 - 15  C-reactive protein   Collection Time: 08/10/19  5:55 AM  Result Value Ref Range   CRP 5.9 (H) <1.0 mg/dL  D-dimer, quantitative (not at [facility])   Collection Time: 08/10/19  5:55 AM  Result Value Ref Range   D-Dimer, Quant 2.08 (H) 0.00 - 0.50 ug/mL-FEU  Ferritin   Collection Time: 08/10/19  5:55 AM  Result Value Ref Range   Ferritin 32 11 - 307 ng/mL  Magnesium   Collection Time: 08/10/19  5:55 AM  Result Value Ref Range   Magnesium 2.0 1.7 - 2.4 mg/dL  Phosphorus   Collection Time: 08/10/19  5:55 AM  Result Value Ref Range   Phosphorus 4.8 (H) 2.5 - 4.6 mg/dL  Lactic acid, plasma   Collection Time: 08/10/19  5:55 AM  Result Value Ref Range   Lactic Acid, Venous 1.0 0.5 - 1.9 mmol/L  CBC with Differential/Platelet   Collection Time: 08/11/19  6:23 AM  Result Value Ref Range   WBC 9.3 4.0 - 10.5 K/uL   RBC 4.53 3.87 - 5.11 MIL/uL   Hemoglobin 10.7 (L) 12.0 - 15.0 g/dL   HCT 04.5 (L) 40.9 - 81.1 %   MCV 73.7 (L) 80.0 - 100.0 fL   MCH 23.6 (L) 26.0 - 34.0 pg   MCHC 32.0 30.0 - 36.0 g/dL   RDW 91.4 (H) 78.2 - 95.6 %   Platelets 152 150 - 400 K/uL   nRBC 0.3 (H) 0.0 - 0.2 %   Neutrophils Relative % 94 %   Neutro Abs 8.7 (H) 1.7 - 7.7 K/uL   Lymphocytes Relative 4 %   Lymphs Abs 0.4 (L) 0.7 - 4.0 K/uL   Monocytes Relative 2 %   Monocytes Absolute 0.2 0.1 - 1.0 K/uL   Eosinophils Relative 0 %   Eosinophils Absolute 0.0 0.0 - 0.5 K/uL  Basophils Relative 0 %   Basophils Absolute 0.0 0.0 - 0.1 K/uL   WBC Morphology See Note    nRBC 2 (H) 0 /100 WBC   Abs Immature Granulocytes 0.00 0.00 - 0.07 K/uL  Comprehensive metabolic panel   Collection Time: 08/11/19  6:23 AM  Result Value Ref Range   Sodium 136 135 - 145 mmol/L   Potassium  3.9 3.5 - 5.1 mmol/L   Chloride 106 98 - 111 mmol/L   CO2 17 (L) 22 - 32 mmol/L   Glucose, Bld 105 (H) 70 - 99 mg/dL   BUN 8 6 - 20 mg/dL   Creatinine, Ser 1.610.84 0.44 - 1.00 mg/dL   Calcium 8.8 (L) 8.9 - 10.3 mg/dL   Total Protein 5.9 (L) 6.5 - 8.1 g/dL   Albumin 2.1 (L) 3.5 - 5.0 g/dL   AST 22 15 - 41 U/L   ALT 8 0 - 44 U/L   Alkaline Phosphatase 160 (H) 38 - 126 U/L   Total Bilirubin 0.6 0.3 - 1.2 mg/dL   GFR calc non Af Amer >60 >60 mL/min   GFR calc Af Amer >60 >60 mL/min   Anion gap 13 5 - 15  C-reactive protein   Collection Time: 08/11/19  6:23 AM  Result Value Ref Range   CRP 3.8 (H) <1.0 mg/dL  D-dimer, quantitative (not at [facility])   Collection Time: 08/11/19  6:23 AM  Result Value Ref Range   D-Dimer, Quant 1.07 (H) 0.00 - 0.50 ug/mL-FEU  Ferritin   Collection Time: 08/11/19  6:23 AM  Result Value Ref Range   Ferritin 40 11 - 307 ng/mL  CBC with Differential/Platelet   Collection Time: 08/12/19  6:17 AM  Result Value Ref Range   WBC 10.6 (H) 4.0 - 10.5 K/uL   RBC 4.76 3.87 - 5.11 MIL/uL   Hemoglobin 11.0 (L) 12.0 - 15.0 g/dL   HCT 09.635.0 (L) 04.536.0 - 40.946.0 %   MCV 73.5 (L) 80.0 - 100.0 fL   MCH 23.1 (L) 26.0 - 34.0 pg   MCHC 31.4 30.0 - 36.0 g/dL   RDW 81.118.7 (H) 91.411.5 - 78.215.5 %   Platelets 165 150 - 400 K/uL   nRBC 0.9 (H) 0.0 - 0.2 %   Neutrophils Relative % 58 %   Neutro Abs 6.2 1.7 - 7.7 K/uL   Lymphocytes Relative 25 %   Lymphs Abs 2.6 0.7 - 4.0 K/uL   Monocytes Relative 4 %   Monocytes Absolute 0.4 0.1 - 1.0 K/uL   Eosinophils Relative 0 %   Eosinophils Absolute 0.0 0.0 - 0.5 K/uL   Basophils Relative 1 %   Basophils Absolute 0.1 0.0 - 0.1 K/uL   Immature Granulocytes 12 %   Abs Immature Granulocytes 1.30 (H) 0.00 - 0.07 K/uL  Comprehensive metabolic panel   Collection Time: 08/12/19  6:17 AM  Result Value Ref Range   Sodium 140 135 - 145 mmol/L   Potassium 4.0 3.5 - 5.1 mmol/L   Chloride 108 98 - 111 mmol/L   CO2 20 (L) 22 - 32 mmol/L   Glucose, Bld 94  70 - 99 mg/dL   BUN 9 6 - 20 mg/dL   Creatinine, Ser 9.560.88 0.44 - 1.00 mg/dL   Calcium 8.7 (L) 8.9 - 10.3 mg/dL   Total Protein 5.8 (L) 6.5 - 8.1 g/dL   Albumin 2.1 (L) 3.5 - 5.0 g/dL   AST 25 15 - 41 U/L   ALT 11 0 - 44 U/L  Alkaline Phosphatase 158 (H) 38 - 126 U/L   Total Bilirubin 0.6 0.3 - 1.2 mg/dL   GFR calc non Af Amer >60 >60 mL/min   GFR calc Af Amer >60 >60 mL/min   Anion gap 12 5 - 15  C-reactive protein   Collection Time: 08/12/19  6:17 AM  Result Value Ref Range   CRP 5.4 (H) <1.0 mg/dL  D-dimer, quantitative (not at [facility])   Collection Time: 08/12/19  6:17 AM  Result Value Ref Range   D-Dimer, Quant 0.87 (H) 0.00 - 0.50 ug/mL-FEU  Ferritin   Collection Time: 08/12/19  6:17 AM  Result Value Ref Range   Ferritin 41 11 - 307 ng/mL  CBC with Differential/Platelet   Collection Time: 08/13/19  6:01 AM  Result Value Ref Range   WBC 10.3 4.0 - 10.5 K/uL   RBC 4.77 3.87 - 5.11 MIL/uL   Hemoglobin 11.1 (L) 12.0 - 15.0 g/dL   HCT 40.9 (L) 81.1 - 91.4 %   MCV 73.0 (L) 80.0 - 100.0 fL   MCH 23.3 (L) 26.0 - 34.0 pg   MCHC 31.9 30.0 - 36.0 g/dL   RDW 78.2 (H) 95.6 - 21.3 %   Platelets 178 150 - 400 K/uL   nRBC 3.2 (H) 0.0 - 0.2 %   Neutrophils Relative % 53 %   Neutro Abs 5.5 1.7 - 7.7 K/uL   Lymphocytes Relative 28 %   Lymphs Abs 2.9 0.7 - 4.0 K/uL   Monocytes Relative 5 %   Monocytes Absolute 0.5 0.1 - 1.0 K/uL   Eosinophils Relative 0 %   Eosinophils Absolute 0.0 0.0 - 0.5 K/uL   Basophils Relative 1 %   Basophils Absolute 0.1 0.0 - 0.1 K/uL   Immature Granulocytes 13 %   Abs Immature Granulocytes 1.34 (H) 0.00 - 0.07 K/uL   Polychromasia PRESENT   Comprehensive metabolic panel   Collection Time: 08/13/19  6:01 AM  Result Value Ref Range   Sodium 139 135 - 145 mmol/L   Potassium 3.7 3.5 - 5.1 mmol/L   Chloride 109 98 - 111 mmol/L   CO2 20 (L) 22 - 32 mmol/L   Glucose, Bld 91 70 - 99 mg/dL   BUN 9 6 - 20 mg/dL   Creatinine, Ser 0.86 0.44 - 1.00 mg/dL    Calcium 8.6 (L) 8.9 - 10.3 mg/dL   Total Protein 5.8 (L) 6.5 - 8.1 g/dL   Albumin 2.0 (L) 3.5 - 5.0 g/dL   AST 28 15 - 41 U/L   ALT 13 0 - 44 U/L   Alkaline Phosphatase 163 (H) 38 - 126 U/L   Total Bilirubin 0.6 0.3 - 1.2 mg/dL   GFR calc non Af Amer >60 >60 mL/min   GFR calc Af Amer >60 >60 mL/min   Anion gap 10 5 - 15  C-reactive protein   Collection Time: 08/13/19  6:01 AM  Result Value Ref Range   CRP 3.2 (H) <1.0 mg/dL  D-dimer, quantitative (not at [facility])   Collection Time: 08/13/19  6:01 AM  Result Value Ref Range   D-Dimer, Quant 1.26 (H) 0.00 - 0.50 ug/mL-FEU  Ferritin   Collection Time: 08/13/19  6:01 AM  Result Value Ref Range   Ferritin 29 11 - 307 ng/mL  CBC with Differential/Platelet   Collection Time: 08/14/19  8:36 AM  Result Value Ref Range   WBC 10.3 4.0 - 10.5 K/uL   RBC 4.99 3.87 - 5.11 MIL/uL   Hemoglobin 11.6 (L)  12.0 - 15.0 g/dL   HCT 16.1 09.6 - 04.5 %   MCV 72.9 (L) 80.0 - 100.0 fL   MCH 23.2 (L) 26.0 - 34.0 pg   MCHC 31.9 30.0 - 36.0 g/dL   RDW 40.9 (H) 81.1 - 91.4 %   Platelets 197 150 - 400 K/uL   nRBC 5.1 (H) 0.0 - 0.2 %   Neutrophils Relative % 55 %   Neutro Abs 5.6 1.7 - 7.7 K/uL   Lymphocytes Relative 28 %   Lymphs Abs 2.9 0.7 - 4.0 K/uL   Monocytes Relative 4 %   Monocytes Absolute 0.4 0.1 - 1.0 K/uL   Eosinophils Relative 0 %   Eosinophils Absolute 0.0 0.0 - 0.5 K/uL   Basophils Relative 1 %   Basophils Absolute 0.1 0.0 - 0.1 K/uL   Immature Granulocytes 12 %   Abs Immature Granulocytes 1.28 (H) 0.00 - 0.07 K/uL  Comprehensive metabolic panel   Collection Time: 08/14/19  8:36 AM  Result Value Ref Range   Sodium 139 135 - 145 mmol/L   Potassium 3.8 3.5 - 5.1 mmol/L   Chloride 109 98 - 111 mmol/L   CO2 21 (L) 22 - 32 mmol/L   Glucose, Bld 85 70 - 99 mg/dL   BUN 10 6 - 20 mg/dL   Creatinine, Ser 7.82 0.44 - 1.00 mg/dL   Calcium 8.7 (L) 8.9 - 10.3 mg/dL   Total Protein 6.0 (L) 6.5 - 8.1 g/dL   Albumin 2.1 (L) 3.5 - 5.0 g/dL    AST 29 15 - 41 U/L   ALT 15 0 - 44 U/L   Alkaline Phosphatase 161 (H) 38 - 126 U/L   Total Bilirubin 0.6 0.3 - 1.2 mg/dL   GFR calc non Af Amer >60 >60 mL/min   GFR calc Af Amer >60 >60 mL/min   Anion gap 9 5 - 15  D-dimer, quantitative (not at [facility])   Collection Time: 08/14/19  8:36 AM  Result Value Ref Range   D-Dimer, Quant 1.68 (H) 0.00 - 0.50 ug/mL-FEU   DG Chest 1 View  Result Date: 08/09/2019 CLINICAL DATA:  Tachypnea. COVID-19 positive. EXAM: CHEST  1 VIEW COMPARISON:  11/17/2010 FINDINGS: There is scattered bilateral airspace opacities. There is no pneumothorax. No large pleural effusion. The heart size is normal. The lung volumes are somewhat low. There is no acute osseous abnormality. IMPRESSION: Multifocal airspace opacities concerning for multifocal pneumonia (viral or bacterial). Electronically Signed   By: Katherine Mantle M.D.   On: 08/09/2019 17:03    No future appointments.  Discharge Condition: Stable  Discharge disposition: 01-Home or Self Care       Discharge Instructions    Discharge activity:  No Restrictions   Complete by: As directed    Discharge diet:  No restrictions   Complete by: As directed    Fetal Kick Count:  Lie on our left side for one hour after a meal, and count the number of times your baby kicks.  If it is less than 5 times, get up, move around and drink some juice.  Repeat the test 30 minutes later.  If it is still less than 5 kicks in an hour, notify your doctor.   Complete by: As directed    LABOR:  When conractions begin, you should start to time them from the beginning of one contraction to the beginning  of the next.  When contractions are 5 - 10 minutes apart or less and have been regular  for at least an hour, you should call your health care provider.   Complete by: As directed    MyChart COVID-19 home monitoring program   Complete by: Aug 14, 2019    Is the patient willing to use the MyChart Mobile App for home monitoring?: Yes    Notify physician for bleeding from the vagina   Complete by: As directed    Notify physician for blurring of vision or spots before the eyes   Complete by: As directed    Notify physician for chills or fever   Complete by: As directed    Notify physician for fainting spells, "black outs" or loss of consciousness   Complete by: As directed    Notify physician for increase in vaginal discharge   Complete by: As directed    Notify physician for leaking of fluid   Complete by: As directed    Notify physician for pain or burning when urinating   Complete by: As directed    Notify physician for pelvic pressure (sudden increase)   Complete by: As directed    Notify physician for severe or continued nausea or vomiting   Complete by: As directed    Notify physician for sudden gushing of fluid from the vagina (with or without continued leaking)   Complete by: As directed    Notify physician for sudden, constant, or occasional abdominal pain   Complete by: As directed    Notify physician if baby moving less than usual   Complete by: As directed    Temperature monitoring   Complete by: Aug 14, 2019    After how many days would you like to receive a notification of this patient's flowsheet entries?: 1     Allergies as of 08/14/2019      Reactions   Pollen Extract       Medication List    TAKE these medications   acetaminophen 500 MG tablet Commonly known as: TYLENOL Take 1,000 mg by mouth every 6 (six) hours as needed for mild pain or headache.   Blood Pressure Monitor Automat Devi 1 Device by Does not apply route daily.   dexamethasone 6 MG tablet Commonly known as: DECADRON Take 1 tablet (6 mg total) by mouth daily for 5 days.   ferrous sulfate 325 (65 FE) MG EC tablet Take 1 tablet (325 mg total) by mouth daily with breakfast.   Gojji Weight Scale Misc 1 Device by Does not apply route daily as needed. To weight self daily as needed at home. ICD-10 code: O09.90   PrePLUS  27-1 MG Tabs Take 1 tablet by mouth daily.      Follow-up Information    [facility] Follow up in 1 week(s).   Specialty: Obstetrics and Gynecology Why: virtual and they will call you with appointment. Contact information: [address] [place] [address] [phone]         Time spent > 30 mins  Signed: Reva Bores M.D. 08/14/2019, 10:14 AM

## 2019-08-15 ENCOUNTER — Inpatient Hospital Stay (HOSPITAL_COMMUNITY)
Admission: AD | Admit: 2019-08-15 | Discharge: 2019-08-15 | Disposition: A | Discharge status: Home / Self Care | Payer: Medicaid Other | Source: Home / Self Care | Attending: Obstetrics & Gynecology | Admitting: Obstetrics & Gynecology

## 2019-08-15 ENCOUNTER — Encounter (INDEPENDENT_AMBULATORY_CARE_PROVIDER_SITE_OTHER): Payer: Self-pay

## 2019-08-15 ENCOUNTER — Encounter (HOSPITAL_COMMUNITY): Payer: Self-pay | Admitting: Obstetrics & Gynecology

## 2019-08-15 DIAGNOSIS — O479 False labor, unspecified: Secondary | ICD-10-CM

## 2019-08-15 DIAGNOSIS — Z3A38 38 weeks gestation of pregnancy: Secondary | ICD-10-CM | POA: Insufficient documentation

## 2019-08-15 DIAGNOSIS — O471 False labor at or after 37 completed weeks of gestation: Secondary | ICD-10-CM | POA: Insufficient documentation

## 2019-08-15 DIAGNOSIS — U071 COVID-19: Secondary | ICD-10-CM

## 2019-08-15 LAB — PATHOLOGIST SMEAR REVIEW: Path Review: REACTIVE

## 2019-08-15 NOTE — Progress Notes (Addendum)
MAU Note Michelle Gallagher is a 24 y.o. G1P0000 at [redacted]w[redacted]d who presents to MAU today complaining contractions q 10 minutes since this morning. She reported some minimal vaginal bleeding. She denies LOF. She reports normal fetal movement.    S: Patient comfortable.   O:  BP 115/70   Pulse 75   Temp 97.7 F (36.5 C) (Oral)   Resp 18   LMP 11/11/2018 (Exact Date)   SpO2 98%  EFM: 135/moderate variability /pos accels, no decels   CVE: Dilation: 1.5 Effacement (%): 50 Cervical Position: Posterior Station: -2 Presentation: Vertex Exam by:: B. Bowen, RN    A&P: 24 y.o. G1P0000 [redacted]w[redacted]d  SIUP at [redacted]w[redacted]d  False labor vs early labor Patient previously admitted for COVID pneumonia in third trimester. RN checked cervix while in MAU and since then and no change > 1 hour in MAU.  Vitals stable NST reactive, reviewed by myself and Mordecai Rasmussen, CNM.     Discharge home with return precautions   Katha Cabal, DO 10:57 AM

## 2019-08-15 NOTE — MAU Note (Signed)
Pt brought back to room 126 d/t positive Covid.status. Reports cntrx since early this morning. Now about 10 mins apart. Denies LOF or VB. +FM. Lives in group home. Was in ED yesterday and received remdesivir.

## 2019-08-15 NOTE — Discharge Instructions (Signed)
Braxton Hicks Contractions °Contractions of the uterus can occur throughout pregnancy, but they are not always a sign that you are in labor. You may have practice contractions called Braxton Hicks contractions. These false labor contractions are sometimes confused with true labor. °What are Braxton Hicks contractions? °Braxton Hicks contractions are tightening movements that occur in the muscles of the uterus before labor. Unlike true labor contractions, these contractions do not result in opening (dilation) and thinning of the cervix. Toward the end of pregnancy (32-34 weeks), Braxton Hicks contractions can happen more often and may become stronger. These contractions are sometimes difficult to tell apart from true labor because they can be very uncomfortable. You should not feel embarrassed if you go to the hospital with false labor. °Sometimes, the only way to tell if you are in true labor is for your health care provider to look for changes in the cervix. The health care provider will do a physical exam and may monitor your contractions. If you are not in true labor, the exam should show that your cervix is not dilating and your water has not broken. °If there are no other health problems associated with your pregnancy, it is completely safe for you to be sent home with false labor. You may continue to have Braxton Hicks contractions until you go into true labor. °How to tell the difference between true labor and false labor °True labor °· Contractions last 30-70 seconds. °· Contractions become very regular. °· Discomfort is usually felt in the top of the uterus, and it spreads to the lower abdomen and low back. °· Contractions do not go away with walking. °· Contractions usually become more intense and increase in frequency. °· The cervix dilates and gets thinner. °False labor °· Contractions are usually shorter and not as strong as true labor contractions. °· Contractions are usually irregular. °· Contractions  are often felt in the front of the lower abdomen and in the groin. °· Contractions may go away when you walk around or change positions while lying down. °· Contractions get weaker and are shorter-lasting as time goes on. °· The cervix usually does not dilate or become thin. °Follow these instructions at home: ° °· Take over-the-counter and prescription medicines only as told by your health care provider. °· Keep up with your usual exercises and follow other instructions from your health care provider. °· Eat and drink lightly if you think you are going into labor. °· If Braxton Hicks contractions are making you uncomfortable: °? Change your position from lying down or resting to walking, or change from walking to resting. °? Sit and rest in a tub of warm water. °? Drink enough fluid to keep your urine pale yellow. Dehydration may cause these contractions. °? Do slow and deep breathing several times an hour. °· Keep all follow-up prenatal visits as told by your health care provider. This is important. °Contact a health care provider if: °· You have a fever. °· You have continuous pain in your abdomen. °Get help right away if: °· Your contractions become stronger, more regular, and closer together. °· You have fluid leaking or gushing from your vagina. °· You pass blood-tinged mucus (bloody show). °· You have bleeding from your vagina. °· You have low back pain that you never had before. °· You feel your baby’s head pushing down and causing pelvic pressure. °· Your baby is not moving inside you as much as it used to. °Summary °· Contractions that occur before labor are   called Braxton Hicks contractions, false labor, or practice contractions. °· Braxton Hicks contractions are usually shorter, weaker, farther apart, and less regular than true labor contractions. True labor contractions usually become progressively stronger and regular, and they become more frequent. °· Manage discomfort from Braxton Hicks contractions  by changing position, resting in a warm bath, drinking plenty of water, or practicing deep breathing. °This information is not intended to replace advice given to you by your health care provider. Make sure you discuss any questions you have with your health care provider. °Document Revised: 03/06/2017 Document Reviewed: 08/07/2016 °Elsevier Patient Education © 2020 [employer] Inc[employer] ° °

## 2019-08-16 ENCOUNTER — Other Ambulatory Visit: Payer: Self-pay

## 2019-08-16 ENCOUNTER — Inpatient Hospital Stay (HOSPITAL_COMMUNITY): Payer: Medicaid Other | Admitting: Anesthesiology

## 2019-08-16 ENCOUNTER — Inpatient Hospital Stay (HOSPITAL_COMMUNITY)
Admission: AD | Admit: 2019-08-16 | Discharge: 2019-08-19 | DRG: 768 | Disposition: A | Discharge status: Home / Self Care | Payer: Medicaid Other | Attending: Family Medicine | Admitting: Family Medicine

## 2019-08-16 ENCOUNTER — Encounter (HOSPITAL_COMMUNITY): Payer: Self-pay | Admitting: Obstetrics & Gynecology

## 2019-08-16 DIAGNOSIS — O99214 Obesity complicating childbirth: Secondary | ICD-10-CM | POA: Diagnosis present

## 2019-08-16 DIAGNOSIS — O1404 Mild to moderate pre-eclampsia, complicating childbirth: Secondary | ICD-10-CM | POA: Diagnosis present

## 2019-08-16 DIAGNOSIS — O9852 Other viral diseases complicating childbirth: Secondary | ICD-10-CM | POA: Diagnosis present

## 2019-08-16 DIAGNOSIS — Z3A38 38 weeks gestation of pregnancy: Secondary | ICD-10-CM

## 2019-08-16 DIAGNOSIS — U071 COVID-19: Secondary | ICD-10-CM | POA: Diagnosis present

## 2019-08-16 DIAGNOSIS — O409XX0 Polyhydramnios, unspecified trimester, not applicable or unspecified: Secondary | ICD-10-CM

## 2019-08-16 DIAGNOSIS — Z3046 Encounter for surveillance of implantable subdermal contraceptive: Secondary | ICD-10-CM | POA: Diagnosis not present

## 2019-08-16 DIAGNOSIS — D563 Thalassemia minor: Secondary | ICD-10-CM | POA: Diagnosis present

## 2019-08-16 DIAGNOSIS — O26893 Other specified pregnancy related conditions, third trimester: Secondary | ICD-10-CM | POA: Diagnosis present

## 2019-08-16 DIAGNOSIS — Z87891 Personal history of nicotine dependence: Secondary | ICD-10-CM | POA: Diagnosis not present

## 2019-08-16 DIAGNOSIS — O98513 Other viral diseases complicating pregnancy, third trimester: Secondary | ICD-10-CM | POA: Diagnosis not present

## 2019-08-16 DIAGNOSIS — Z30017 Encounter for initial prescription of implantable subdermal contraceptive: Secondary | ICD-10-CM

## 2019-08-16 DIAGNOSIS — J1282 Pneumonia due to coronavirus disease 2019: Secondary | ICD-10-CM | POA: Diagnosis not present

## 2019-08-16 DIAGNOSIS — O99210 Obesity complicating pregnancy, unspecified trimester: Secondary | ICD-10-CM | POA: Diagnosis present

## 2019-08-16 DIAGNOSIS — O26813 Pregnancy related exhaustion and fatigue, third trimester: Secondary | ICD-10-CM | POA: Diagnosis present

## 2019-08-16 DIAGNOSIS — O1414 Severe pre-eclampsia complicating childbirth: Secondary | ICD-10-CM | POA: Diagnosis not present

## 2019-08-16 HISTORY — DX: Obesity, unspecified: E66.9

## 2019-08-16 HISTORY — DX: Polyhydramnios, unspecified trimester, not applicable or unspecified: O40.9XX0

## 2019-08-16 HISTORY — DX: Pneumonia, unspecified organism: J18.9

## 2019-08-16 HISTORY — DX: Thalassemia minor: D56.3

## 2019-08-16 LAB — COMPREHENSIVE METABOLIC PANEL
ALT: 12 U/L (ref 0–44)
AST: 20 U/L (ref 15–41)
Albumin: 2.1 g/dL — ABNORMAL LOW (ref 3.5–5.0)
Alkaline Phosphatase: 160 U/L — ABNORMAL HIGH (ref 38–126)
Anion gap: 10 (ref 5–15)
BUN: 11 mg/dL (ref 6–20)
CO2: 23 mmol/L (ref 22–32)
Calcium: 8.5 mg/dL — ABNORMAL LOW (ref 8.9–10.3)
Chloride: 105 mmol/L (ref 98–111)
Creatinine, Ser: 0.94 mg/dL (ref 0.44–1.00)
GFR calc Af Amer: >60 mL/min (ref >60)
GFR calc non Af Amer: >60 mL/min (ref >60)
Glucose, Bld: 80 mg/dL (ref 70–99)
Potassium: 3.8 mmol/L (ref 3.5–5.1)
Sodium: 138 mmol/L (ref 135–145)
Total Bilirubin: 0.8 mg/dL (ref 0.3–1.2)
Total Protein: 5.8 g/dL — ABNORMAL LOW (ref 6.5–8.1)

## 2019-08-16 LAB — CBC
HCT: 37.4 % (ref 36.0–46.0)
HCT: 37.9 % (ref 36.0–46.0)
Hemoglobin: 11.6 g/dL — ABNORMAL LOW (ref 12.0–15.0)
Hemoglobin: 11.7 g/dL — ABNORMAL LOW (ref 12.0–15.0)
MCH: 22.9 pg — ABNORMAL LOW (ref 26.0–34.0)
MCH: 22.9 pg — ABNORMAL LOW (ref 26.0–34.0)
MCHC: 31 g/dL (ref 30.0–36.0)
MCHC: 30.9 g/dL (ref 30.0–36.0)
MCV: 73.8 fL — ABNORMAL LOW (ref 80.0–100.0)
MCV: 74 fL — ABNORMAL LOW (ref 80.0–100.0)
Platelets: 204 10*3/uL (ref 150–400)
Platelets: 195 10*3/uL (ref 150–400)
RBC: 5.07 MIL/uL (ref 3.87–5.11)
RBC: 5.12 MIL/uL — ABNORMAL HIGH (ref 3.87–5.11)
RDW: 19.2 % — ABNORMAL HIGH (ref 11.5–15.5)
RDW: 19.1 % — ABNORMAL HIGH (ref 11.5–15.5)
WBC: 7.6 10*3/uL (ref 4.0–10.5)
WBC: 8.9 10*3/uL (ref 4.0–10.5)
nRBC: 1.7 % — ABNORMAL HIGH (ref 0.0–0.2)
nRBC: 1 % — ABNORMAL HIGH (ref 0.0–0.2)

## 2019-08-16 LAB — PROTEIN / CREATININE RATIO, URINE
Creatinine, Urine: 132.26 mg/dL
Protein Creatinine Ratio: 0.46 mg/mg{Cre} — ABNORMAL HIGH (ref 0.00–0.15)
Total Protein, Urine: 61 mg/dL

## 2019-08-16 LAB — TYPE AND SCREEN
ABO/RH(D): O POS
Antibody Screen: NEGATIVE

## 2019-08-16 LAB — RPR: RPR Ser Ql: NONREACTIVE

## 2019-08-16 MED ORDER — OXYTOCIN 40 UNITS IN NORMAL SALINE INFUSION - SIMPLE MED
2.5000 [IU]/h | INTRAVENOUS | Status: DC
  Administered 2019-08-17: 2.5 [IU]/h via INTRAVENOUS
  Filled 2019-08-16: qty 1000

## 2019-08-16 MED ORDER — LACTATED RINGERS IV SOLN
500.0000 mL | INTRAVENOUS | Status: DC | PRN
  Administered 2019-08-16 (×2): 500 mL via INTRAVENOUS

## 2019-08-16 MED ORDER — SODIUM CHLORIDE (PF) 0.9 % IJ SOLN
INTRAMUSCULAR | Status: DC | PRN
  Administered 2019-08-16: 12 mL/h via EPIDURAL

## 2019-08-16 MED ORDER — ACETAMINOPHEN 325 MG PO TABS: 650.0000 mg | ORAL_TABLET | ORAL | Status: DC | PRN

## 2019-08-16 MED ORDER — LIDOCAINE HCL (PF) 1 % IJ SOLN
INTRAMUSCULAR | Status: DC | PRN
  Administered 2019-08-16 (×2): 4 mL via EPIDURAL

## 2019-08-16 MED ORDER — PHENYLEPHRINE 40 MCG/ML (10ML) SYRINGE FOR IV PUSH (FOR BLOOD PRESSURE SUPPORT)
80.0000 ug | PREFILLED_SYRINGE | INTRAVENOUS | Status: DC | PRN
  Filled 2019-08-16: qty 10

## 2019-08-16 MED ORDER — LACTATED RINGERS IV SOLN: 500.0000 mL | Freq: Once | INTRAVENOUS | Status: AC

## 2019-08-16 MED ORDER — SOD CITRATE-CITRIC ACID 500-334 MG/5ML PO SOLN
30.0000 mL | ORAL | Status: DC | PRN
  Administered 2019-08-16: 30 mL via ORAL
  Filled 2019-08-16: qty 30

## 2019-08-16 MED ORDER — FENTANYL-BUPIVACAINE-NACL 0.5-0.125-0.9 MG/250ML-% EP SOLN
12.0000 mL/h | EPIDURAL | Status: DC | PRN
  Administered 2019-08-17: 12 mL/h via EPIDURAL
  Filled 2019-08-16 (×2): qty 250

## 2019-08-16 MED ORDER — DIPHENHYDRAMINE HCL 50 MG/ML IJ SOLN: 12.5000 mg | INTRAMUSCULAR | Status: DC | PRN

## 2019-08-16 MED ORDER — OXYTOCIN BOLUS FROM INFUSION
500.0000 mL | Freq: Once | INTRAVENOUS | Status: AC
  Administered 2019-08-17: 500 mL via INTRAVENOUS

## 2019-08-16 MED ORDER — EPHEDRINE 5 MG/ML INJ: 10.0000 mg | INTRAVENOUS | Status: DC | PRN

## 2019-08-16 MED ORDER — LACTATED RINGERS IV SOLN: INTRAVENOUS | Status: DC

## 2019-08-16 MED ORDER — DEXAMETHASONE 6 MG PO TABS
6.0000 mg | ORAL_TABLET | Freq: Every day | ORAL | Status: DC
  Administered 2019-08-16: 14:00:00 6 mg via ORAL
  Filled 2019-08-16 (×2): qty 1

## 2019-08-16 MED ORDER — FENTANYL CITRATE (PF) 100 MCG/2ML IJ SOLN
50.0000 ug | INTRAMUSCULAR | Status: DC | PRN
  Administered 2019-08-16: 100 ug via INTRAVENOUS
  Filled 2019-08-16: qty 2

## 2019-08-16 MED ORDER — PHENYLEPHRINE 40 MCG/ML (10ML) SYRINGE FOR IV PUSH (FOR BLOOD PRESSURE SUPPORT): 80.0000 ug | PREFILLED_SYRINGE | INTRAVENOUS | Status: DC | PRN

## 2019-08-16 MED ORDER — OXYTOCIN 40 UNITS IN NORMAL SALINE INFUSION - SIMPLE MED
1.0000 m[IU]/min | INTRAVENOUS | Status: DC
  Administered 2019-08-16: 2 m[IU]/min via INTRAVENOUS

## 2019-08-16 MED ORDER — TERBUTALINE SULFATE 1 MG/ML IJ SOLN: 0.2500 mg | Freq: Once | INTRAMUSCULAR | Status: DC | PRN

## 2019-08-16 MED ORDER — ONDANSETRON HCL 4 MG/2ML IJ SOLN
4.0000 mg | Freq: Four times a day (QID) | INTRAMUSCULAR | Status: DC | PRN
  Administered 2019-08-16: 4 mg via INTRAVENOUS
  Filled 2019-08-16: qty 2

## 2019-08-16 MED ORDER — LIDOCAINE HCL (PF) 1 % IJ SOLN
30.0000 mL | INTRAMUSCULAR | Status: AC | PRN
  Administered 2019-08-17: 30 mL via SUBCUTANEOUS
  Filled 2019-08-16: qty 30

## 2019-08-16 MED ORDER — LACTATED RINGERS IV SOLN
500.0000 mL | Freq: Once | INTRAVENOUS | Status: AC
  Administered 2019-08-16: 500 mL via INTRAVENOUS

## 2019-08-16 NOTE — Progress Notes (Signed)
Labor Progress Note Michelle Gallagher is a 24 y.o. G1P0000 at [redacted]w[redacted]d presented for SOL.   S: Patient comfortable.  Denies headache, RUQ pain, blurry vision/seeing spots, shortness of breath, myalgias.  O:  BP (!) 148/74   Pulse 60   Temp 97.6 F (36.4 C) (Axillary)   Resp 18   Ht 5\' 5"  (1.651 m)   Wt 131.5 kg   LMP 11/11/2018 (Exact Date)   SpO2 100%   BMI 48.26 kg/m  EFM: 140/moderate variability/pos accels, no decels  TOCO: every 2-3 minutes   CVE: Dilation: 8 Effacement (%): 80 Station: -2 Presentation: Vertex Exam by:: Dr [email or web address]   A&P: 24 y.o. G1P0000 [redacted]w[redacted]d admitted for SOL.   #Labor: Progressing s/p AROM.  Continue pitocin. Continue with position changes and peanut ball.  #Pain: epidural  #FWB: Cat 1  #GBS negative  #New onset pre-eclampsia: max SBP 158 / max DBP 92. She is asymptomatic. Pre-E labs were obtained that significant for elevated Pr/Cr ratio 0.46,  platelets dropped slightly from 204 to 195, AST and ALT WNL. Will not start Mg gtt as patient is without severe range pressures or features.   #COVID positive:  Pt asymptomatic.  Recent O2 saturations 98-100%.  Maintain oxygen saturations >95%.   [redacted]w[redacted]d, DO 6:09 PM

## 2019-08-16 NOTE — MAU Note (Signed)
CTX since 0400 this morning, now 7 min apart.  Reports good fetal movement.  Denies vaginal bleeding or LOF.

## 2019-08-16 NOTE — Anesthesia Procedure Notes (Signed)
Epidural Patient location during procedure: OB Start time: 08/16/2019 11:30 AM End time: 08/16/2019 11:42 AM  Staffing Anesthesiologist: Mal Amabile, MD Performed: anesthesiologist   Preanesthetic Checklist Completed: patient identified, IV checked, site marked, risks and benefits discussed, surgical consent, monitors and equipment checked, pre-op evaluation and timeout performed  Epidural Patient position: sitting Prep: DuraPrep and site prepped and draped Patient monitoring: continuous pulse ox and blood pressure Approach: midline Location: L4-L5 Injection technique: LOR air  Needle:  Needle type: Tuohy  Needle gauge: 17 G Needle length: 9 cm and 9 Needle insertion depth: 9 cm Catheter type: closed end flexible Catheter size: 19 Gauge Catheter at skin depth: 14 cm Test dose: negative and Other  Assessment Events: blood not aspirated, injection not painful, no injection resistance, no paresthesia and negative IV test  Additional Notes Patient identified. Risks and benefits discussed including failed block, incomplete  Pain control, post dural puncture headache, nerve damage, paralysis, blood pressure Changes, nausea, vomiting, reactions to medications-both toxic and allergic and post Partum back pain. All questions were answered. Patient expressed understanding and wished to proceed. Sterile technique was used throughout procedure. Epidural site was Dressed with sterile barrier dressing. Transient paresthesia right leg, no signs of intravascular injection Or signs of intrathecal spread were encountered.  Patient was more comfortable after the epidural was dosed. Attempt x 2. Difficult due to MO and poor positioning. Please see RN's note for documentation of vital signs and FHR which are stable. Reason for block:procedure for pain

## 2019-08-16 NOTE — H&P (Addendum)
OBSTETRIC ADMISSION HISTORY AND PHYSICAL  Naba D Doswell is a 24 y.o. female G1P0000 with IUP at 16w5dby UKoreapresenting for SOL. She reports +FMs, No LOF, no VB, no blurry vision, headaches or peripheral edema, and RUQ pain.  Denies cough, myalgias, fever, shortness of breath, sore/scratchy throat. She plans on breast and bottle feeding. She request Nexplanon for birth control. 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     Past Medical History: Past Medical History:  Diagnosis Date   Asthma    COVID-19 08/06/2019   Obesity    Pneumonia     Past Surgical History: Past Surgical History:  Procedure Laterality Date   NO PAST SURGERIES      Obstetrical History: OB History     Gravida  1   Para  0   Term  0   Preterm  0   AB  0   Living  0      SAB  0   TAB  0   Ectopic  0   Multiple  0   Live Births  0           Social History Social History   Socioeconomic History   Marital status: Single    Spouse name: Not on file   Number of children: Not on file   Years of education: Not on file   Highest education level: GED or equivalent  Occupational History   Not on file  Tobacco Use   Smoking status: Former Smoker    Packs/day: 0.10    Types: Cigarettes    Quit date: 12/07/2018    Years since quitting: 0.6   Smokeless tobacco: Never Used  Substance and Sexual Activity   Alcohol use: Not Currently   Drug use: No   Sexual activity: Not Currently    Birth control/protection: None  Other Topics Concern   Not on file  Social History Narrative   Lives in group home   Social Determinants of Health    Financial Resource Strain: Medium Risk   Difficulty of Paying Living Expenses: Somewhat hard  Food Insecurity: FLandscape architectPresent   Worried About RCharity fundraiserin the Last Year: Sometimes true   RArboriculturistin the Last Year: Sometimes true  Transportation Needs: Unmet Transportation Needs   Lack of Transportation (Medical): Yes   Lack of Transportation (Non-Medical): Yes  Physical Activity: Inactive   Days of Exercise per Week: 0 days   Minutes of Exercise per Session: 0 min  Stress: No Stress Concern Present   Feeling of Stress : Not at all  Social Connections: Moderately Isolated   Frequency of Communication with Friends and Family: More than three times a week   Frequency of Social Gatherings with Friends and Family: More than three times a week   Attends Religious Services: Never   AMarine scientistor Organizations: No   Attends CArchivistMeetings: Never   Marital Status: Never married    Family History: Family History  Problem Relation Age of Onset   Multiple sclerosis Mother     Allergies: Allergies  Allergen  Reactions   Pollen Extract     Medications Prior to Admission  Medication Sig Dispense Refill Last Dose   acetaminophen (TYLENOL) 500 MG tablet Take 1,000 mg by mouth every 6 (six) hours as needed for mild pain or headache.    Past Week at Unknown time   Blood Pressure Monitoring (BLOOD PRESSURE MONITOR AUTOMAT) DEVI 1 Device by Does not apply route daily. 1 kit 0    dexamethasone (DECADRON) 6 MG tablet Take 1 tablet (6 mg total) by mouth daily for 5 days. 5 tablet 0    ferrous sulfate 325 (65 FE) MG EC tablet Take 1 tablet (325 mg total) by mouth daily with breakfast. (Patient not taking: Reported on 08/16/2019) 45 tablet 3 Not Taking at Unknown time   Misc. Devices (GOJJI WEIGHT SCALE) MISC 1 Device by Does not apply route daily as needed. To weight self daily as needed at home. ICD-10 code: O09.90 1 each 0    Prenatal Vit-Fe  Fumarate-FA (PREPLUS) 27-1 MG TABS Take 1 tablet by mouth daily. (Patient not taking: Reported on 08/11/2019) 60 tablet 6 Not Taking at Unknown time     Review of Systems   All systems reviewed and negative except as stated in HPI  Blood pressure 125/82, pulse 61, temperature (!) 97.3 F (36.3 C), resp. rate 18, height _0  (1.651 m), weight 131.5 kg, last menstrual period 11/11/2018, SpO2 100 %. General appearance: alert and no distress Lungs: clear to auscultation bilaterally Heart: regular rate and rhythm Abdomen: soft, non-tender; bowel sounds normal Pelvic: gravid uterus Extremities: Homans sign is negative, no sign of DVT Presentation: cephalic Fetal monitoringBaseline: 140 bpm, Variability: Good {> 6 bpm), Accelerations: Reactive and Decelerations: Absent Uterine activityFrequency: Every 4-7 minutes Dilation: 5 Effacement (%): 80 Station: -2 Exam by:: Rush Farmer, RN   Prenatal labs: ABO, Rh: --/--/PENDING (05/11 9532) Antibody: PENDING (05/11 0926) Rubella: 1.78 (10/30 1134) RPR: Non Reactive (03/05 0824)  HBsAg: NON REACTIVE (05/04 1830)  HIV: Non Reactive (03/05 0824)  GBS: Negative/-- (04/22 1031)  2 hr Glucola (83, 132, 99) Genetic screening: low risk NIPS, AFP positive  Anatomy US: fetal arrhythmia    Prenatal Transfer Tool  Maternal Diabetes: No Genetic Screening: Abnormal:  Results: Elevated risk of Trisomy 21 (positive AFP),  Maternal Ultrasounds/Referrals: Other:fetal arrhythmia -PACs  Fetal Ultrasounds or other Referrals:  None Maternal Substance Abuse:  No Significant Maternal Medications:  None Significant Maternal Lab Results: Group B Strep negative  Results for orders placed or performed during the hospital encounter of 08/16/19 (from the past 24 hour(s))  CBC   Collection Time: 08/16/19  9:26 AM  Result Value Ref Range   WBC 7.6 4.0 - 10.5 K/uL   RBC 5.07 3.87 - 5.11 MIL/uL   Hemoglobin 11.6 (L) 12.0 - 15.0 g/dL   HCT 37.4 36.0 - 46.0 %    MCV 73.8 (L) 80.0 - 100.0 fL   MCH 22.9 (L) 26.0 - 34.0 pg   MCHC 31.0 30.0 - 36.0 g/dL   RDW 19.2 (H) 11.5 - 15.5 %   Platelets 204 150 - 400 K/uL   nRBC 1.7 (H) 0.0 - 0.2 %  Type and screen [place]   Collection Time: 08/16/19  9:26 AM  Result Value Ref Range   ABO/RH(D) PENDING    Antibody Screen PENDING    Sample Expiration      08/19/2019,2359 Performed at [facility], [place] [address] [place], [place] [address]  Patient Active Problem List   Diagnosis Date Noted   Indication for care in labor and delivery, antepartum 08/16/2019   Pneumonia due to COVID-19 virus 08/09/2019   COVID-19 infection affecting pregnancy in third trimester 08/07/2019   Polyhydramnios affecting pregnancy 08/03/2019   Abnormal antenatal AFP screen 03/15/2019   Maternal morbid obesity, antepartum ([place]) 02/04/2019   Supervision of high-risk pregnancy 01/24/2019    Assessment/Plan:  Shantell D Tegeler is a 24 y.o. G1P0000 at 76w5dhere for SOL.    #Labor: Admit to L&D. CVE in MAU 5/80/-2. AROM discussed and patient agreeable.  Successful AROM. Reassess in 3-4 hours.  #Pain: Planning epidural #FWB:  Cat 1  #ID:  None, GBS negative  #MOF: breast and bottle  #MOC:Nexplanon  #Circ:  NA, girl   #COVID positive: Admitted and treated for COVID pneumonia one week ago.  Pt within the 21-day infectious window. Airborne and droplet precautions.  Continuous pulse oxymetry. Monitor respiratory status and maintain oxygen saturation >95%. Continue dexamethasone for 5 additional days.   VLyndee Hensen DO  08/16/2019, 10:58 AM   Attestation of Supervision of Student:  I confirm that I have verified the information documented in the  resident 's note and that I have also personally reperformed the history, physical exam and all medical decision making activities.  I have verified that all services and findings are accurately documented in this student's note; and I agree with  management and plan as outlined in the documentation. I have also made any necessary editorial changes.   RLaury Deep CKivalinafor WDean [employer] Company CMontaraGroup 08/16/2019 6:29 PM

## 2019-08-16 NOTE — Progress Notes (Addendum)
Labor Progress Note Michelle Gallagher is a 24 y.o. G1P0000 at [redacted]w[redacted]d presented for SOL  Discussed plan with nursing for next few hours: Will recheck patient around 9pm and consider IUPC if no change. Pt feeling pressure according to nursing.

## 2019-08-16 NOTE — Progress Notes (Signed)
Labor Progress Note Michelle Gallagher is a 24 y.o. G1P0000 at [redacted]w[redacted]d presented for SOL  S: Some pressure with contractions. Much less pain since epidural.   O:  BP 125/80   Pulse 68   Temp 98.5 F (36.9 C) (Oral)   Resp 16   Ht 5\' 5"  (1.651 m)   Wt 131.5 kg   LMP 11/11/2018 (Exact Date)   SpO2 100%   BMI 48.26 kg/m  FHT: 135 bpm, large variability, accels present, no decels TOCO (s/p IUPC): q2 min  CVE: Dilation: 8.5 Effacement (%): 80 Station: -1 Presentation: Vertex Exam by:: MScott, RN   A&P: [name] her for SOL #Labor: s/p AROM. Cont pitocin. IUPC placed. Titrate meds to MVU ~240-250. Baby ROT on exam. Pt lying on L side with peanut ball. Will recheck around midnight. Anticipate vaginal delivery.  #Pain: s/p epidural #FWB: Cat 1, 3479g #GBS negative  #New onset pre-eclampsia: max SBP 158 / max DBP 92. She is asymptomatic. Pre-E labs were obtained that significant for elevated Pr/Cr ratio 0.46,  platelets dropped slightly from 204 to 195, AST and ALT WNL. Will not start Mg gtt as patient is without severe range pressures or features.   #COVID positive:  Pt asymptomatic.  Recent O2 saturations 98-100%.  Maintain oxygen saturations >95%.   [redacted]w[redacted]d, DO 9:45 PM

## 2019-08-16 NOTE — Anesthesia Preprocedure Evaluation (Signed)
Anesthesia Evaluation  Patient identified by MRN, date of birth, ID band Patient awake    Reviewed: Allergy & Precautions, Patient's Chart, lab work & pertinent test results  Airway Mallampati: III  TM Distance: >3 FB Neck ROM: Full    Dental no notable dental hx.    Pulmonary asthma , pneumonia, resolved, former smoker,  Covid + with pneumonia 08/06/19   Pulmonary exam normal breath sounds clear to auscultation       Cardiovascular negative cardio ROS Normal cardiovascular exam Rhythm:Regular Rate:Normal     Neuro/Psych negative neurological ROS  negative psych ROS   GI/Hepatic Neg liver ROS, GERD  Medicated,  Endo/Other  Morbid obesity  Renal/GU negative Renal ROS  negative genitourinary   Musculoskeletal negative musculoskeletal ROS (+)   Abdominal (+) + obese,   Peds  Hematology  (+) anemia ,   Anesthesia Other Findings   Reproductive/Obstetrics (+) Pregnancy                             Anesthesia Physical Anesthesia Plan  ASA: III  Anesthesia Plan: Epidural   Post-op Pain Management:    Induction:   PONV Risk Score and Plan:   Airway Management Planned: Natural Airway  Additional Equipment:   Intra-op Plan:   Post-operative Plan:   Informed Consent: I have reviewed the patients History and Physical, chart, labs and discussed the procedure including the risks, benefits and alternatives for the proposed anesthesia with the patient or authorized representative who has indicated his/her understanding and acceptance.       Plan Discussed with: Anesthesiologist  Anesthesia Plan Comments:         Anesthesia Quick Evaluation

## 2019-08-17 ENCOUNTER — Encounter (HOSPITAL_COMMUNITY): Payer: Self-pay | Admitting: Family Medicine

## 2019-08-17 DIAGNOSIS — Z3A38 38 weeks gestation of pregnancy: Secondary | ICD-10-CM

## 2019-08-17 DIAGNOSIS — O1414 Severe pre-eclampsia complicating childbirth: Secondary | ICD-10-CM

## 2019-08-17 DIAGNOSIS — U071 COVID-19: Secondary | ICD-10-CM

## 2019-08-17 DIAGNOSIS — J1282 Pneumonia due to coronavirus disease 2019: Secondary | ICD-10-CM

## 2019-08-17 DIAGNOSIS — O98513 Other viral diseases complicating pregnancy, third trimester: Secondary | ICD-10-CM

## 2019-08-17 MED ORDER — PRENATAL MULTIVITAMIN CH
1.0000 | ORAL_TABLET | Freq: Every day | ORAL | Status: DC
  Administered 2019-08-17 – 2019-08-19 (×3): 1 via ORAL
  Filled 2019-08-17 (×3): qty 1

## 2019-08-17 MED ORDER — DIPHENHYDRAMINE HCL 25 MG PO CAPS: 25.0000 mg | ORAL_CAPSULE | Freq: Four times a day (QID) | ORAL | Status: DC | PRN

## 2019-08-17 MED ORDER — SIMETHICONE 80 MG PO CHEW: 80.0000 mg | CHEWABLE_TABLET | ORAL | Status: DC | PRN

## 2019-08-17 MED ORDER — ZOLPIDEM TARTRATE 5 MG PO TABS: 5.0000 mg | ORAL_TABLET | Freq: Every evening | ORAL | Status: DC | PRN

## 2019-08-17 MED ORDER — COCONUT OIL OIL: 1.0000 "application " | TOPICAL_OIL | Status: DC | PRN

## 2019-08-17 MED ORDER — WITCH HAZEL-GLYCERIN EX PADS: 1.0000 "application " | MEDICATED_PAD | CUTANEOUS | Status: DC | PRN

## 2019-08-17 MED ORDER — SENNOSIDES-DOCUSATE SODIUM 8.6-50 MG PO TABS
2.0000 | ORAL_TABLET | ORAL | Status: DC
  Administered 2019-08-18 – 2019-08-19 (×3): 2 via ORAL
  Filled 2019-08-17 (×2): qty 2

## 2019-08-17 MED ORDER — ONDANSETRON HCL 4 MG PO TABS: 4.0000 mg | ORAL_TABLET | ORAL | Status: DC | PRN

## 2019-08-17 MED ORDER — OXYCODONE HCL 5 MG PO TABS
5.0000 mg | ORAL_TABLET | ORAL | Status: DC | PRN
  Administered 2019-08-17 – 2019-08-18 (×2): 5 mg via ORAL
  Filled 2019-08-17 (×2): qty 1

## 2019-08-17 MED ORDER — IBUPROFEN 600 MG PO TABS
600.0000 mg | ORAL_TABLET | Freq: Four times a day (QID) | ORAL | Status: DC
  Administered 2019-08-17 – 2019-08-19 (×8): 600 mg via ORAL
  Filled 2019-08-17 (×8): qty 1

## 2019-08-17 MED ORDER — TETANUS-DIPHTH-ACELL PERTUSSIS 5-2.5-18.5 LF-MCG/0.5 IM SUSP: 0.5000 mL | Freq: Once | INTRAMUSCULAR | Status: DC

## 2019-08-17 MED ORDER — ACETAMINOPHEN 325 MG PO TABS
650.0000 mg | ORAL_TABLET | ORAL | Status: DC | PRN
  Administered 2019-08-18: 650 mg via ORAL
  Filled 2019-08-17: qty 2

## 2019-08-17 MED ORDER — POLYETHYLENE GLYCOL 3350 17 G PO PACK
17.0000 g | PACK | Freq: Every day | ORAL | Status: DC
  Administered 2019-08-17 – 2019-08-19 (×3): 17 g via ORAL
  Filled 2019-08-17 (×3): qty 1

## 2019-08-17 MED ORDER — CEFAZOLIN SODIUM-DEXTROSE 2-4 GM/100ML-% IV SOLN: 2.0000 g | Freq: Once | INTRAVENOUS | Status: DC

## 2019-08-17 MED ORDER — DIBUCAINE (PERIANAL) 1 % EX OINT: 1.0000 "application " | TOPICAL_OINTMENT | CUTANEOUS | Status: DC | PRN

## 2019-08-17 MED ORDER — DEXTROSE 5 % IV SOLN
3.0000 g | Freq: Once | INTRAVENOUS | Status: DC
  Filled 2019-08-17 (×2): qty 3000

## 2019-08-17 MED ORDER — ONDANSETRON HCL 4 MG/2ML IJ SOLN: 4.0000 mg | INTRAMUSCULAR | Status: DC | PRN

## 2019-08-17 MED ORDER — DEXAMETHASONE 6 MG PO TABS
6.0000 mg | ORAL_TABLET | Freq: Every day | ORAL | Status: DC
  Administered 2019-08-17 – 2019-08-18 (×2): 6 mg via ORAL
  Filled 2019-08-17 (×2): qty 1

## 2019-08-17 MED ORDER — SENNOSIDES-DOCUSATE SODIUM 8.6-50 MG PO TABS
1.0000 | ORAL_TABLET | Freq: Two times a day (BID) | ORAL | Status: DC
  Administered 2019-08-17 – 2019-08-18 (×2): 1 via ORAL
  Filled 2019-08-17 (×3): qty 1

## 2019-08-17 MED ORDER — BENZOCAINE-MENTHOL 20-0.5 % EX AERO
1.0000 "application " | INHALATION_SPRAY | CUTANEOUS | Status: DC | PRN
  Filled 2019-08-17: qty 56

## 2019-08-17 NOTE — Discharge Summary (Signed)
Postpartum Discharge Summary     Patient Name: Michelle Gallagher DOB: April 04, 1996 MRN: [id number]  Date of admission: 08/16/2019 Delivery date:08/17/2019  Delivering provider: Chauncey Mann  Date of discharge: 08/19/2019  Admitting diagnosis: Indication for care in labor and delivery, antepartum [O75.9] Intrauterine pregnancy: [redacted]w[redacted]d    Secondary diagnosis:  Active Problems:   M[place] morbid [place], [place]   COVID-19 infection affecting pregnancy in third trimester   Indication for care in labor and delivery, antepartum   Vacuum-assisted vaginal delivery   Type 3c perineal laceration  Additional problems: None    Discharge diagnosis: Term Pregnancy Delivered and Preeclampsia (mild)                                              Post partum procedures:Nexplanon insertion Augmentation: AROM and Pitocin Complications: None  Hospital course: Onset of Labor With Vaginal Delivery      24y.o. yo G1P0000 at 384w6das admitted in Latent Labor on 08/16/2019. Patient had an uncomplicated labor course as follows: Initial SVE: 5/80/-2. Patient received AROM and Pitocin. She then progressed to complete. Indication for operative vaginal delivery: NRFHT, Maternal Fatigue, Pushing for 2+ hours and complete for about 6 hours with minimal descent, Occiput Posterior position.  Membrane Rupture Time/Date: 12:23 PM ,08/16/2019   Delivery Method:Vaginal, Vacuum (Extractor)  Episiotomy: None  Lacerations:  3rd degree  Patient had an uncomplicated postpartum course. Patient with 3c perineal laceration and was given Ancef post-partum. Bowel regimen initiated along with pain medications. F/u in the office scheduled for laceration check. Patient diagnosed with Pre-E without severe features intrapartum and BP's monitored post-partum and did not require anti-hypertensive. She is ambulating, tolerating a regular diet, passing flatus, and urinating well. Patient is discharged home in stable condition on  08/19/19.  Newborn Data: Birth date:08/17/2019  Birth time:4:59 AM  Gender:Female  Living status:Living  Apgars:7 ,9   Magnesium Sulfate received: No BMZ received: No Rhophylac:No MMR:No T-DaP:Given prenatally Flu: Yes Transfusion:No  Physical exam  Vitals:   08/18/19 1453 08/18/19 1953 08/18/19 2240 08/19/19 0605  BP: 120/84 125/83 118/70 125/63  Pulse: 72 60 65 63  Resp: '18 18 18   ' Temp: 98.6 F (37 C) 97.9 F (36.6 C) 98.5 F (36.9 C) 98.2 F (36.8 C)  TempSrc: Oral Oral Oral Oral  SpO2:  100% 100% 100%  Weight:      Height:       General: alert, cooperative and no distress Lochia: appropriate Uterine Fundus: firm Incision: N/A DVT Evaluation: No evidence of DVT seen on physical exam. Labs: Lab Results  Component Value Date   WBC 8.9 08/16/2019   HGB 11.7 (L) 08/16/2019   HCT 37.9 08/16/2019   MCV 74.0 (L) 08/16/2019   PLT 195 08/16/2019   CMP Latest Ref Rng & Units 08/16/2019  Glucose 70 - 99 mg/dL 80  BUN 6 - 20 mg/dL 11  Creatinine 0.44 - 1.00 mg/dL 0.94  Sodium 135 - 145 mmol/L 138  Potassium 3.5 - 5.1 mmol/L 3.8  Chloride 98 - 111 mmol/L 105  CO2 22 - 32 mmol/L 23  Calcium 8.9 - 10.3 mg/dL 8.5(L)  Total Protein 6.5 - 8.1 g/dL 5.8(L)  Total Bilirubin 0.3 - 1.2 mg/dL 0.8  Alkaline Phos 38 - 126 U/L 160(H)  AST 15 - 41 U/L 20  ALT 0 - 44  U/L 12   Edinburgh Score: Edinburgh Postnatal Depression Scale Screening Tool 08/18/2019  I have been able to laugh and see the funny side of things. 0  I have looked forward with enjoyment to things. 0  I have blamed myself unnecessarily when things went wrong. 1  I have been anxious or worried for no good reason. 0  I have felt scared or panicky for no good reason. 1  Things have been getting on top of me. 1  I have been so unhappy that I have had difficulty sleeping. 0  I have felt sad or miserable. 1  I have been so unhappy that I have been crying. 0  The thought of harming myself has occurred to me. 0   Edinburgh Postnatal Depression Scale Total 4      After visit meds:  Allergies as of 08/19/2019      Reactions   Pollen Extract       Medication List    STOP taking these medications   dexamethasone 6 MG tablet Commonly known as: DECADRON   Gojji Weight Scale Misc     TAKE these medications   acetaminophen 325 MG tablet Commonly known as: Tylenol Take 2 tablets (650 mg total) by mouth every 6 (six) hours as needed (for pain scale < 4). What changed:   medication strength  how much to take  reasons to take this   Blood Pressure Monitor Automat Devi 1 Device by Does not apply route daily.   ferrous sulfate 325 (65 FE) MG EC tablet Take 1 tablet (325 mg total) by mouth daily with breakfast.   ibuprofen 600 MG tablet Commonly known as: ADVIL Take 1 tablet (600 mg total) by mouth every 6 (six) hours.   oxyCODONE 5 MG immediate release tablet Commonly known as: Oxy IR/ROXICODONE Take 1 tablet (5 mg total) by mouth every 4 (four) hours as needed for moderate pain.   polyethylene glycol 17 g packet Commonly known as: MIRALAX / GLYCOLAX Take 17 g by mouth daily.   PrePLUS 27-1 MG Tabs Take 1 tablet by mouth daily.   senna-docusate 8.6-50 MG tablet Commonly known as: Senokot-S Take 1 tablet by mouth 2 (two) times daily.        Discharge home in stable condition Infant Feeding: Bottle and Breast Infant Disposition:home with mother Discharge instruction: per After Visit Summary and Postpartum booklet. Activity: Advance as tolerated. Pelvic rest for 6 weeks.  Diet: routine diet Anticipated Birth Control: Nexplanon Postpartum Appointment:4 weeks Additional Postpartum F/U: Incision check 1 week for 3c tear Future Appointments: Future Appointments  Date Time Provider [place]  08/31/2019  2:50 PM Laury Deep, CNM [facility] None  09/29/2019  1:30 PM Laury Deep, CNM [facility] None   Follow up Visit:      08/19/2019 Chauncey Mann, MD

## 2019-08-17 NOTE — Consult Note (Signed)
Neonatology Note:   Attendance at Delivery:    I was asked by Dr. Adrian Blackwater to attend this NSVD at term for decels, vacuum, and meconium . The mother is a G1, GBS neg with good prenatal care complicated by polyhydramnios, morbid obesity and abnormal AFP screen with normal fetal scan. No GDM.  She required for treatment of Covid during this hospital stay nasal cannula, Decadron, Zinc, Vitamin C and Diflucan. ROM 16h 58m prior to delivery, fluid clear. Infant vigorous with good tone but  delayed spontaneous cry. Cord clamped after ~30sec and baby brought to warmer.  Received bulb suctioning for large amount of meconium fluid in nares and mouth.  Raspy cry.  Pink. Clearing breathe sounds equal bilateral.  No WOB. Active.  Ap 7/9.  Family updated.  To CN to care of Pediatrician.  Dineen Kid Leary Roca, MD

## 2019-08-17 NOTE — Progress Notes (Addendum)
Patient complete since 2245 and we labored down for 1 hour with minimal descent. Patient has been pushing for past hour and 0/+1 with pushing. FSE placed due to difficulty tracing infant. Have pushed in 3 different positions. Effort seems descent. FHR remarkable for variables, lates and prolonged decels but mostly moderate variability in between ctx. D/w Dr. Adrian Blackwater. Will labor down for another 45 min to an hour and then start pushing again. MVU's are adequate. Patient to exaggerated right lateral with peanut ball. Cat I currently. EFW 3700g.  Jerilynn Birkenhead, MD [facility] Fellow, [facility] for [employer], [facility]

## 2019-08-17 NOTE — Progress Notes (Signed)
Call from nurse that pt is fully dilated but is at 0 station. Will work with pt on pushing and then call back when station is lower.

## 2019-08-17 NOTE — Anesthesia Postprocedure Evaluation (Signed)
Anesthesia Post Note  Patient: Michelle Gallagher  Procedure(s) Performed: AN AD HOC LABOR EPIDURAL     Patient location during evaluation: Mother Baby Anesthesia Type: Epidural Level of consciousness: awake and alert and oriented Pain management: satisfactory to patient Vital Signs Assessment: post-procedure vital signs reviewed and stable Respiratory status: respiratory function stable Cardiovascular status: stable Postop Assessment: no headache, no backache, epidural receding, patient able to bend at knees, no signs of nausea or vomiting, adequate PO intake, no apparent nausea or vomiting and able to ambulate Anesthetic complications: no    Last Vitals:  Vitals:   08/17/19 0922 08/17/19 1243  BP: 115/86 108/70  Pulse: 86 95  Resp: 20 20  Temp: 36.9 C   SpO2:  100%    Last Pain:  Vitals:   08/17/19 1243  TempSrc: Oral  PainSc: 4    Pain Goal:                Epidural/Spinal Function Cutaneous sensation: Normal sensation (08/17/19 1243), Patient able to flex knees: Yes (08/17/19 1243), Patient able to lift hips off bed: Yes (08/17/19 1243), Back pain beyond tenderness at insertion site: No (08/17/19 1243), Progressively worsening motor and/or sensory loss: No (08/17/19 1243)  Jaiceon Collister

## 2019-08-17 NOTE — Lactation Note (Signed)
This note was copied from a baby's chart. Lactation Consultation Note  Patient Name: Michelle Gallagher Today's Date: 08/17/2019 Reason for consult: Initial assessment;Early term 37-38.6wks;[place] Mom covid positive.  Baby is 6 hours old and has been to the breast once.  Baby is currently sleeping in crib and mom was sleeping when I came in.  Mom appears very tired so teaching done was limited.  Instructed to breastfeed with any cue and call for assist prn.  Breastfeeding consultation services information left for patient.  Maternal Data    Feeding Feeding Type: Breast Fed  LATCH Score Latch: Grasps breast easily, tongue down, lips flanged, rhythmical sucking.  Audible Swallowing: A few with stimulation  Type of Nipple: Everted at rest and after stimulation  Comfort (Breast/Nipple): Soft / non-tender  Hold (Positioning): Assistance needed to correctly position infant at breast and maintain latch.  LATCH Score: 8  Interventions    Lactation Tools Discussed/Used     Consult Status Consult Status: Follow-up Date: 08/18/19    Huston Foley 08/17/2019, 11:37 AM

## 2019-08-17 NOTE — Progress Notes (Signed)
CSW acknowledges consult and completed chart review. MOB is currently residing at [facility] and is provided with a wealth of community resources for housing, mental health, parenting, and any other resources requested by MOB.  MOB also has a case manager that MOB meets with weekly at [facility].     Please contact the Clinical Social Worker if needs arise, by MOB request, or if MOB scores greater than 9/yes to question 10 on Edinburgh Postpartum Depression Screen.  CSW identifies no further need for intervention and no barriers to discharge at this time.  Annajulia Lewing S. Clement Deneault, MSW, LCSW [facility] [place] [phone]   

## 2019-08-18 DIAGNOSIS — Z3046 Encounter for surveillance of implantable subdermal contraceptive: Secondary | ICD-10-CM

## 2019-08-18 MED ORDER — ASPIRIN 81 MG PO CHEW
81.0000 mg | CHEWABLE_TABLET | Freq: Every day | ORAL | Status: DC
  Filled 2019-08-18: qty 1

## 2019-08-18 MED ORDER — ETONOGESTREL 68 MG ~~LOC~~ IMPL
68.0000 mg | DRUG_IMPLANT | Freq: Once | SUBCUTANEOUS | Status: AC
  Administered 2019-08-18: 68 mg via SUBCUTANEOUS
  Filled 2019-08-18: qty 1

## 2019-08-18 MED ORDER — LIDOCAINE HCL 1 % IJ SOLN
0.0000 mL | Freq: Once | INTRAMUSCULAR | Status: AC | PRN
  Administered 2019-08-18: 3 mL via INTRADERMAL
  Filled 2019-08-18: qty 20

## 2019-08-18 NOTE — Lactation Note (Addendum)
This note was copied from a baby's chart. Lactation Consultation Note  Patient Name: Michelle Gallagher Today's Date: 08/18/2019  Baby just went under Bili lights.  Mom in rest room.. Infant not wanting to take a pacifier.  Showed family how to let infant suck on a gloved finger to calm her. Infant clamps and sucks intermittently. Left infant calm.  Mom to call when gets out of rest room.   Maternal Data    Feeding Feeding Type: Formula Nipple Type: Slow - flow  LATCH Score                   Interventions    Lactation Tools Discussed/Used     Consult Status      Emmalynne Courtney Michaelle Copas 08/18/2019, 12:52 PM

## 2019-08-18 NOTE — Progress Notes (Signed)
CSW spoke with MOB via phone to confirm that she has all needed items to care for infant. MOB reported that she stays at [facility] where she is connected with resources. MOB reported to this CSW that she has WIC already "just have to call and get an appointment". CSW understanding offered MOB contact informationfor WIC in which  MOB reports that she already has. MOB reported that she has carseat, clothing all essential items needed to care for infant once arrived home. MOB reported no other concerns or needs to CSW at this time.      Toan Mort S. Malayia Spizzirri, MSW, LCSW [facility] [place] [phone]   

## 2019-08-18 NOTE — Lactation Note (Signed)
This note was copied from a baby's chart. Lactation Consultation Note  Patient Name: Girl Alianis Daniely Today's Date: 08/18/2019 Reason for consult: Follow-up assessment Telephone call from RN reporting mom ready to be seen.  Inititiated pumping with mom.  Hard to hear her with the noise in room  Mom did not want to breastfed at this feeding.   Wanted to pump and give formula and breastmilk from a bottle. Reviewed pumping techniques and how to wash her pump parts. Forgot sterilizing spray so gave to her nurse to give to her.  Urged her to use the breastpump at every feeding regardless of how she fed baby.  Mom reports she does not feel she has enough milk.  Inquired about breastfeeding.  Mom reports her nipples feel fine. Discussed infant biting on finger under lights in crib. Mom reports she only bites on your finger. Urged her to call lactation as needed.    Maternal Data    Feeding Feeding Type: Formula Nipple Type: Slow - flow  LATCH Score                   Interventions    Lactation Tools Discussed/Used     Consult Status Consult Status: Follow-up Date: 08/18/19 Follow-up type: In-patient    Constancia Geeting Michaelle Copas 08/18/2019, 2:26 PM

## 2019-08-18 NOTE — Procedures (Signed)
GYNECOLOGY PROCEDURE NOTE  Michelle Gallagher is a 24 y.o. G1P1001 requesting Nexplanon insertion. No gynecologic concerns.  Nexplanon Insertion Procedure Patient identified, informed consent performed, consent signed. Patient does understand that irregular bleeding is a very common side effect of this medication. She was advised to have backup contraception for one week after placement. Appropriate time out taken. Patient's right arm was prepped and draped in the usual sterile fashion. The insertion area was measured and marked. Patient was prepped with alcohol swab and then injected with 3 ml of 1% lidocaine. The area was then prepped with betadine. Nexplanon removed from packaging and device confirmed present within needle, then inserted full length of needle and withdrawn per handbook instructions. Nexplanon was able to palpated in the patient's arm; patient palpated the insert herself. There was minimal blood loss. Patient insertion site covered with steri strip, guaze, and a pressure bandage to reduce any bruising. The patient tolerated the procedure well and was given post procedure instructions.

## 2019-08-18 NOTE — Progress Notes (Signed)
Post Partum Day 1 Subjective: no complaints, up ad lib, voiding and tolerating PO. Breast and formula feeding.   Objective: Blood pressure 105/62, pulse 71, temperature 97.7 F (36.5 C), temperature source Oral, resp. rate 20, height 5\' 5"  (1.651 m), weight 131.5 kg, last menstrual period 11/11/2018, SpO2 97 %, unknown if currently breastfeeding.  Physical Exam:  General: alert, cooperative and no distress Lochia: appropriate Uterine Fundus: firm Incision: healing well, no significant drainage, no dehiscence, no significant erythema DVT Evaluation: No evidence of DVT seen on physical exam. Negative Homan's sign. No cords or calf tenderness. No significant calf/ankle edema.  Recent Labs    08/16/19 0926 08/16/19 1711  HGB 11.6* 11.7*  HCT 37.4 37.9    Assessment/Plan: Plan for discharge tomorrow, Breastfeeding, Lactation consult, Social Work consult and Contraception Nexplanon placed today 3rd degree laceration- continue bowel regimen Recent hx of Covid-19 pneumonia: stable, last dose of Decadron today (5 day course ordered by medicine service)   LOS: 2 days   10/16/19, CNM 08/18/2019, 11:52 AM

## 2019-08-19 MED ORDER — POLYETHYLENE GLYCOL 3350 17 G PO PACK: 17.0000 g | PACK | Freq: Every day | ORAL | 0 refills | Status: DC

## 2019-08-19 MED ORDER — OXYCODONE HCL 5 MG PO TABS: 5.0000 mg | ORAL_TABLET | ORAL | 0 refills | Status: DC | PRN

## 2019-08-19 MED ORDER — SENNOSIDES-DOCUSATE SODIUM 8.6-50 MG PO TABS: 1.0000 | ORAL_TABLET | Freq: Two times a day (BID) | ORAL | 0 refills | Status: DC

## 2019-08-19 MED ORDER — IBUPROFEN 600 MG PO TABS: 600.0000 mg | ORAL_TABLET | Freq: Four times a day (QID) | ORAL | 0 refills | Status: DC

## 2019-08-19 MED ORDER — ACETAMINOPHEN 325 MG PO TABS: 650.0000 mg | ORAL_TABLET | Freq: Four times a day (QID) | ORAL | 0 refills | Status: AC | PRN

## 2019-08-19 NOTE — Lactation Note (Signed)
This note was copied from a baby's chart. Lactation Consultation Note  Patient Name: Michelle Gallagher Today's Date: 08/19/2019 Reason for consult: Follow-up assessment    LC phoned mother to plan a good time for a consult.   Infant was on double photo. Now off. Will check bili again this afternoon.  Encouraged mother to do freq STS.   Mother reports that she plans to feed infant again around 2:00 pm.. Mother wants  assistance with placing infant to the breast. Mother reports that she has been pumping but she is only getting drops on her nipples.   LC will plan to see mother with next feeding to assist.   Maternal Data    Feeding Feeding Type: Bottle Fed - Formula  LATCH Score                   Interventions    Lactation Tools Discussed/Used     Consult Status Consult Status: Follow-up Date: 08/19/19 Follow-up type: In-patient    Stevan Born [facility] 08/19/2019, 1:14 PM

## 2019-08-19 NOTE — Progress Notes (Signed)
RN attempted to give pt scheduled Ibuprofen.  PT reports to RN that she took an ibuprofen pill that meds to bed pharmacy delivered to her room for discharge.  RN held the dose listed in pt MAR due to this and educated pt on letting staff administer her prescribed medications while she is still a pt.

## 2019-08-19 NOTE — Progress Notes (Signed)
Infection Prevention  Received call from: RN Marrisa  Unit: 5S Mother Baby Regarding: Covid+ on May 1, Asymptomatic  IP Recommendation: Reviewed notes and VS. Consulted w/RN. Pt >10days and asymptomatic. Recommend Iso to be DC'd.

## 2019-08-19 NOTE — Lactation Note (Signed)
This note was copied from a baby's chart. Lactation Consultation Note  Patient Name: Michelle Gallagher Today's Date: 08/19/2019 Reason for consult: Follow-up assessment   Mother is a P38, infant is 7 hours old.  Mother has a DEBP sat up at the bedside. Mother has a used PIS at home. She now has tubes. Mother reports that she has not pumped since 5 this a,.m. Mother reports she knows that breastmilk is good for her infant.   Mother assist ti the chair and taught proper postioining in football and cross cradle hold. Infant suckled a few times . Spit a large amt and then voided . Discussed treatment and prevention of engorgement.   Plan of Care : Breastfeed infant with feeding cues Supplement infant with ebm/formula, according to supplemental guidelines. Pump using a DEBP after each feeding for 15-20 mins.   Mother to continue to cue base feed infant and feed at least 8-12 times or more in 24 hours and advised to allow for cluster feeding infant as needed.   Mother to continue to due STS. Mother is aware of available LC services at [facility], BFSG'S, OP Dept, and phone # for questions or concerns about breastfeeding.  Mother receptive to all teaching and plan of care.     Maternal Data    Feeding Feeding Type: Breast Fed  LATCH Score                   Interventions Interventions: Assisted with latch;Skin to skin;Hand express;Breast compression;Adjust position;Support pillows;Position options;Hand pump;DEBP  Lactation Tools Discussed/Used     Consult Status Consult Status: Follow-up Date: 08/19/19 Follow-up type: In-patient    Stevan Born [facility] 08/19/2019, 2:48 PM

## 2019-08-20 ENCOUNTER — Encounter (HOSPITAL_COMMUNITY): Payer: Self-pay | Admitting: Obstetrics & Gynecology

## 2019-08-20 ENCOUNTER — Inpatient Hospital Stay (HOSPITAL_COMMUNITY)
Admission: AD | Admit: 2019-08-20 | Discharge: 2019-08-20 | Disposition: A | Discharge status: Home / Self Care | Payer: Medicaid Other | Attending: Obstetrics & Gynecology | Admitting: Obstetrics & Gynecology

## 2019-08-20 ENCOUNTER — Other Ambulatory Visit: Payer: Self-pay

## 2019-08-20 DIAGNOSIS — Z8616 Personal history of COVID-19: Secondary | ICD-10-CM | POA: Insufficient documentation

## 2019-08-20 DIAGNOSIS — O99893 Other specified diseases and conditions complicating puerperium: Secondary | ICD-10-CM

## 2019-08-20 DIAGNOSIS — Z87891 Personal history of nicotine dependence: Secondary | ICD-10-CM | POA: Diagnosis not present

## 2019-08-20 DIAGNOSIS — M7989 Other specified soft tissue disorders: Secondary | ICD-10-CM | POA: Diagnosis present

## 2019-08-20 DIAGNOSIS — R600 Localized edema: Secondary | ICD-10-CM

## 2019-08-20 NOTE — MAU Provider Note (Signed)
Chief Complaint: swelling   First Provider Initiated Contact with Patient 08/20/19 1210     SUBJECTIVE HPI: Michelle Gallagher is a 24 y.o. G1P1001 at 3 days postpartum who presents to Maternity Admissions reporting lower extremity swelling. Swelling has increased since she was discharged yesterday and makes her feet feel tight & uncomfortable. Denies calf pain, cough, chest pain, or shortness of breath.    Past Medical History:  Diagnosis Date  . Alpha thalassemia silent carrier   . Asthma   . COVID-19 08/06/2019  . Obesity   . Pneumonia   . Polyhydramnios    OB History  Gravida Para Term Preterm AB Living  _0 0 0 1  SAB TAB Ectopic Multiple Live Births  0 0 0 0 1    # Outcome Date GA Lbr Len/2nd Weight Sex Delivery Anes PTL Lv  1 Term 08/17/19 [id number] / 06:14 3470 g F Vag-Vacuum EPI  LIV   Past Surgical History:  Procedure Laterality Date  . NO PAST SURGERIES     Social History   Socioeconomic History  . Marital status: Single    Spouse name: Not on file  . Number of children: Not on file  . Years of education: Not on file  . Highest education level: GED or equivalent  Occupational History  . Not on file  Tobacco Use  . Smoking status: Former Smoker    Packs/day: 0.10    Types: Cigarettes    Quit date: 12/07/2018    Years since quitting: 0.7  . Smokeless tobacco: Never Used  Substance and Sexual Activity  . Alcohol use: Not Currently  . Drug use: No  . Sexual activity: Not Currently    Birth control/protection: None  Other Topics Concern  . Not on file  Social History Narrative   Lives in group home   Social Determinants of Health   Financial Resource Strain: Medium Risk  . Difficulty of Paying Living Expenses: Somewhat hard  Food Insecurity: Food Insecurity Present  . Worried About RCharity fundraiserin the Last Year: Sometimes true  . Ran Out of Food in the Last Year: Sometimes true  Transportation Needs: Unmet Transportation Needs  . Lack of  Transportation (Medical): Yes  . Lack of Transportation (Non-Medical): Yes  Physical Activity: Inactive  . Days of Exercise per Week: 0 days  . Minutes of Exercise per Session: 0 min  Stress: No Stress Concern Present  . Feeling of Stress : Not at all  Social Connections: Moderately Isolated  . Frequency of Communication with Friends and Family: More than three times a week  . Frequency of Social Gatherings with Friends and Family: More than three times a week  . Attends Religious Services: Never  . Active Member of Clubs or Organizations: No  . Attends CArchivistMeetings: Never  . Marital Status: Never married  Intimate Partner Violence: Not At Risk  . Fear of Current or Ex-Partner: No  . Emotionally Abused: No  . Physically Abused: No  . Sexually Abused: No   Family History  Problem Relation Age of Onset  . Multiple sclerosis Mother    No current facility-administered medications on file prior to encounter.   Current Outpatient Medications on File Prior to Encounter  Medication Sig Dispense Refill  . acetaminophen (TYLENOL) 325 MG tablet Take 2 tablets (650 mg total) by mouth every 6 (six) hours as needed (for pain scale < 4). 30 tablet 0  . Blood Pressure Monitoring (  BLOOD PRESSURE MONITOR AUTOMAT) DEVI 1 Device by Does not apply route daily. 1 kit 0  . ibuprofen (ADVIL) 600 MG tablet Take 1 tablet (600 mg total) by mouth every 6 (six) hours. 30 tablet 0  . oxyCODONE (OXY IR/ROXICODONE) 5 MG immediate release tablet Take 1 tablet (5 mg total) by mouth every 4 (four) hours as needed for moderate pain. 8 tablet 0  . polyethylene glycol (MIRALAX / GLYCOLAX) 17 g packet Take 17 g by mouth daily. 14 each 0  . senna-docusate (SENOKOT-S) 8.6-50 MG tablet Take 1 tablet by mouth 2 (two) times daily. 60 tablet 0  . [DISCONTINUED] promethazine (PHENERGAN) 25 MG tablet Take 1 tablet (25 mg total) by mouth every 6 (six) hours as needed for nausea or vomiting. 30 tablet 2    Allergies  Allergen Reactions  . Pollen Extract     I have reviewed patient's Past Medical Hx, Surgical Hx, Family Hx, Social Hx, medications and allergies.   Review of Systems  Constitutional: Negative.   Respiratory: Negative.   Cardiovascular: Positive for leg swelling. Negative for chest pain.  Gastrointestinal: Negative.     OBJECTIVE Patient Vitals for the past 24 hrs:  BP Temp Temp src Pulse Resp SpO2  08/20/19 1232 -- -- -- -- 20 --  08/20/19 1201 (!) 128/59 -- -- (!) 111 -- --  08/20/19 1200 -- -- -- -- -- 100 %  08/20/19 1147 110/64 (!) 97.5 F (36.4 C) Oral 92 19 99 %   Constitutional: Well-developed, well-nourished female in no acute distress.  Cardiovascular: normal rate & rhythm, no murmur. Good pedal pulses Respiratory: normal rate and effort. Lung sounds clear throughout GI: Abd soft, non-tender, Pos BS x 4. No guarding or rebound tenderness MS: 2+ pitting edema BLE. Edema equal in both extremities. Extremities nontender, normal ROM Neurologic: Alert and oriented x 4.      LAB RESULTS No results found for this or any previous visit (from the past 24 hour(s)).  IMAGING No results found.  MAU COURSE Orders Placed This Encounter  Procedures  . Discharge patient   No orders of the defined types were placed in this encounter.   MDM Normotensive No complaints other than LE swelling which is equal bilaterally.  Patient given option of short course of diuretic vs elevation & compression. Patient opts to elevate at home.   ASSESSMENT 1. Bilateral lower extremity edema     PLAN Discharge home in stable condition. Discussed reasons to return to MAU   Allergies as of 08/20/2019      Reactions   Pollen Extract       Medication List    STOP taking these medications   ferrous sulfate 325 (65 FE) MG EC tablet   PrePLUS 27-1 MG Tabs     TAKE these medications   acetaminophen 325 MG tablet Commonly known as: Tylenol Take 2 tablets (650 mg  total) by mouth every 6 (six) hours as needed (for pain scale < 4).   Blood Pressure Monitor Automat Devi 1 Device by Does not apply route daily.   ibuprofen 600 MG tablet Commonly known as: ADVIL Take 1 tablet (600 mg total) by mouth every 6 (six) hours.   oxyCODONE 5 MG immediate release tablet Commonly known as: Oxy IR/ROXICODONE Take 1 tablet (5 mg total) by mouth every 4 (four) hours as needed for moderate pain.   polyethylene glycol 17 g packet Commonly known as: MIRALAX / GLYCOLAX Take 17 g by mouth daily.  senna-docusate 8.6-50 MG tablet Commonly known as: Senokot-S Take 1 tablet by mouth 2 (two) times daily.        Jorje Guild, NP 08/20/2019  2:27 PM

## 2019-08-20 NOTE — MAU Note (Signed)
Michelle Gallagher is a 24 y.o. PP Vaginal Delivery 5/12 here in MAU reporting:  +lower extremity swelling Denies Headache or epigastric pain. +floaters; patient reports this has been ongoing. Onset of complaint: ongoing; however reports it has gotten worse since being discharged from the hospital yesterday Pain score: denies Vitals:   08/20/19 1147  BP: 110/64  Pulse: 92  Resp: 19  Temp: (!) 97.5 F (36.4 C)  SpO2: 99%      Lab orders placed from triage: none

## 2019-08-20 NOTE — Discharge Instructions (Signed)
Edema  Edema is when you have too much fluid in your body or under your skin. Edema may make your legs, feet, and ankles swell up. Swelling is also common in looser tissues, like around your eyes. This is a common condition. It gets more common as you get older. There are many possible causes of edema. Eating too much salt (sodium) and being on your feet or sitting for a long time can cause edema in your legs, feet, and ankles. Hot weather may make edema worse. Edema is usually painless. Your skin may look swollen or shiny. Follow these instructions at home:  Keep the swollen body part raised (elevated) above the level of your heart when you are sitting or lying down.  Do not sit still or stand for a long time.  Do not wear tight clothes. Do not wear garters on your upper legs.  Exercise your legs. This can help the swelling go down.  Wear elastic bandages or support stockings as told by your doctor.  Eat a low-salt (low-sodium) diet to reduce fluid as told by your doctor.  Depending on the cause of your swelling, you may need to limit how much fluid you drink (fluid restriction).  Take over-the-counter and prescription medicines only as told by your doctor. Contact a doctor if:  Treatment is not working.  You have heart, liver, or kidney disease and have symptoms of edema.  You have sudden and unexplained weight gain. Get help right away if:  You have shortness of breath or chest pain.  You cannot breathe when you lie down.  You have pain, redness, or warmth in the swollen areas.  You have heart, liver, or kidney disease and get edema all of a sudden.  You have a fever and your symptoms get worse all of a sudden. Summary  Edema is when you have too much fluid in your body or under your skin.  Edema may make your legs, feet, and ankles swell up. Swelling is also common in looser tissues, like around your eyes.  Raise (elevate) the swollen body part above the level of your  heart when you are sitting or lying down.  Follow your doctor's instructions about diet and how much fluid you can drink (fluid restriction). This information is not intended to replace advice given to you by your health care provider. Make sure you discuss any questions you have with your health care provider. Document Revised: 03/27/2017 Document Reviewed: 04/11/2016 Elsevier Patient Education  2020 Elsevier Inc.  

## 2019-08-31 ENCOUNTER — Ambulatory Visit (INDEPENDENT_AMBULATORY_CARE_PROVIDER_SITE_OTHER): Payer: Medicaid Other | Admitting: Obstetrics and Gynecology

## 2019-08-31 ENCOUNTER — Encounter: Payer: Self-pay | Admitting: Obstetrics and Gynecology

## 2019-08-31 ENCOUNTER — Other Ambulatory Visit: Payer: Self-pay

## 2019-08-31 VITALS — BP 119/70 | HR 95 | Temp 98.3°F | Wt 266.8 lb

## 2019-08-31 DIAGNOSIS — Z5189 Encounter for other specified aftercare: Secondary | ICD-10-CM

## 2019-08-31 MED ORDER — OXYCODONE HCL 5 MG PO TABS: 5.0000 mg | ORAL_TABLET | ORAL | 0 refills | Status: AC | PRN

## 2019-08-31 NOTE — Patient Instructions (Signed)
USE THE SITZ BATH TWICE DAILY TO HELP YOUR BOTTOM HEAL PROPERLY!!  Wound Care, Adult Taking care of your wound properly can help to prevent pain, infection, and scarring. It can also help your wound to heal more quickly. How to care for your wound Wound care      Follow instructions from your health care provider about how to take care of your wound. Make sure you: ? Wash your hands with soap and water before you change the bandage (dressing). If soap and water are not available, use hand sanitizer. ? Change your dressing as told by your health care provider. ? Leave stitches (sutures), skin glue, or adhesive strips in place. These skin closures may need to stay in place for 2 weeks or longer. If adhesive strip edges start to loosen and curl up, you may trim the loose edges. Do not remove adhesive strips completely unless your health care provider tells you to do that.  Check your wound area every day for signs of infection. Check for: ? Redness, swelling, or pain. ? Fluid or blood. ? Warmth. ? Pus or a bad smell.  Ask your health care provider if you should clean the wound with mild soap and water. Doing this may include: ? Using a clean towel to pat the wound dry after cleaning it. Do not rub or scrub the wound. ? Applying a cream or ointment. Do this only as told by your health care provider. ? Covering the incision with a clean dressing.  Ask your health care provider when you can leave the wound uncovered.  Keep the dressing dry until your health care provider says it can be removed. Do not take baths, swim, use a hot tub, or do anything that would put the wound underwater until your health care provider approves. Ask your health care provider if you can take showers. You may only be allowed to take sponge baths. Medicines   If you were prescribed an antibiotic medicine, cream, or ointment, take or use the antibiotic as told by your health care provider. Do not stop taking or  using the antibiotic even if your condition improves.  Take over-the-counter and prescription medicines only as told by your health care provider. If you were prescribed pain medicine, take it 30 or more minutes before you do any wound care or as told by your health care provider. General instructions  Return to your normal activities as told by your health care provider. Ask your health care provider what activities are safe.  Do not scratch or pick at the wound.  Do not use any products that contain nicotine or tobacco, such as cigarettes and e-cigarettes. These may delay wound healing. If you need help quitting, ask your health care provider.  Keep all follow-up visits as told by your health care provider. This is important.  Eat a diet that includes protein, vitamin A, vitamin C, and other nutrient-rich foods to help the wound heal. ? Foods rich in protein include meat, dairy, beans, nuts, and other sources. ? Foods rich in vitamin A include carrots and dark green, leafy vegetables. ? Foods rich in vitamin C include citrus, tomatoes, and other fruits and vegetables. ? Nutrient-rich foods have protein, carbohydrates, fat, vitamins, or minerals. Eat a variety of healthy foods including vegetables, fruits, and whole grains. Contact a health care provider if:  You received a tetanus shot and you have swelling, severe pain, redness, or bleeding at the injection site.  Your pain is not controlled  with medicine.  You have redness, swelling, or pain around the wound.  You have fluid or blood coming from the wound.  Your wound feels warm to the touch.  You have pus or a bad smell coming from the wound.  You have a fever or chills.  You are nauseous or you vomit.  You are dizzy. Get help right away if:  You have a red streak going away from your wound.  The edges of the wound open up and separate.  Your wound is bleeding, and the bleeding does not stop with gentle  pressure.  You have a rash.  You faint.  You have trouble breathing. Summary  Always wash your hands with soap and water before changing your bandage (dressing).  To help with healing, eat foods that are rich in protein, vitamin A, vitamin C, and other nutrients.  Check your wound every day for signs of infection. Contact your health care provider if you suspect that your wound is infected. This information is not intended to replace advice given to you by your health care provider. Make sure you discuss any questions you have with your health care provider. Document Revised: 07/12/2018 Document Reviewed: 10/09/2015 Elsevier Patient Education  2020 [employer].

## 2019-08-31 NOTE — Progress Notes (Signed)
Subjective:     Michelle Gallagher is a 24 y.o. female who presents to the office 1 weeks status post vacuum-assisted vaginal birth for third degree perineal laceration follow-up. Eating a regular diet without difficulty. Bowel movements are normal. Pain is not well controlled.  Medications being used: ibuprofen (OTC). She ran out of Roxicet that was Rx'd upon discharge home.  The following portions of the patient's history were reviewed and updated as appropriate: allergies, current medications, past family history, past medical history, past social history, past surgical history and problem list.  Review of Systems Constitutional: negative Eyes: negative Ears, nose, mouth, throat, and face: negative Respiratory: negative Cardiovascular: negative Gastrointestinal: negative Genitourinary:positive for pain Integument/breast: negative Hematologic/lymphatic: negative Musculoskeletal:negative Neurological: negative Behavioral/Psych: negative Endocrine: negative Allergic/Immunologic: negative    Objective:    BP 119/70 (BP Location: Right Arm, Patient Position: Sitting, Cuff Size: Large)   Pulse 95   Temp 98.3 F (36.8 C) (Oral)   Wt 266 lb 12.8 oz (121 kg)   BMI 44.40 kg/m  General:  alert, cooperative and no distress  Abdomen: soft, bowel sounds active, non-tender  Incision:   healing well, no drainage, no erythema, no hernia, no seroma, no swelling, no dehiscence, incision well approximated     Assessment:  1) Visit for wound check 2) Third degree laceration of perineum, type 3c  - Rx for oxyCODONE (OXY IR/ROXICODONE) 5 MG immediate release tablet   Doing well postoperatively.   Plan:   1. Continue any current medications. 2. Wound care discussed. Advised to purchase Sitz bath and use twice daily. Information provided on wound care.  3. Activity restrictions: no driving 4. Anticipated return to work: 4 weeks. 5. Follow up: 4 weeks for postpartum visit.   Raelyn Mora, CNM 08/31/2019 3:27 PM

## 2019-09-29 ENCOUNTER — Encounter: Payer: Self-pay | Admitting: Obstetrics and Gynecology

## 2019-09-29 ENCOUNTER — Telehealth (INDEPENDENT_AMBULATORY_CARE_PROVIDER_SITE_OTHER): Payer: Medicaid Other | Admitting: Obstetrics and Gynecology

## 2019-09-29 DIAGNOSIS — M549 Dorsalgia, unspecified: Secondary | ICD-10-CM

## 2019-09-29 DIAGNOSIS — Z3040 Encounter for surveillance of contraceptives, unspecified: Secondary | ICD-10-CM

## 2019-09-29 NOTE — Progress Notes (Signed)
I connected with@ on 09/29/19 at  1:30 PM EDT by: Mychart video and verified that I am speaking with the correct person using two identifiers.  Patient is located at father's house and provider is located at [employer] for [employer] at [place] .     The purpose of this virtual visit is to provide medical care while limiting exposure to the novel coronavirus. I discussed the limitations, risks, security and privacy concerns of performing an evaluation and management service by My Chart and the availability of in person appointments. I also discussed with the patient that there may be a patient responsible charge related to this service. By engaging in this virtual visit, you consent to the provision of healthcare.  Additionally, you authorize for your insurance to be billed for the services provided during this visit.  The patient expressed understanding and agreed to proceed.  The following staff members participated in the virtual visit:  Lewayne Bunting, CMA & Raelyn Mora, CNM   Post Partum Visit Note Subjective:   Michelle Gallagher is a 24 y.o. G87P1001 female being evaluated for postpartum followup.  She is 5 weeks postpartum following a vacuum-assisted vaginal delivery at  [redacted]w[redacted]d gestational weeks.  I have fully reviewed the prenatal and intrapartum course; pregnancy complicated by obesity & polyhydramnios.  Postpartum course upper and lower back pain. Baby is doing well. Baby is feeding by both breast and bottle - Daron Offer. Bleeding spotting. Bowel function is normal. Bladder function is normal. Patient is not sexually active. Contraception method is Nexplanon. Postpartum depression screening: negative.  The following portions of the patient's history were reviewed and updated as appropriate: allergies, current medications, past family history, past medical history, past social history, past surgical history and problem list.  Review of Systems Constitutional:  negative Eyes: negative Ears, nose, mouth, throat, and face: negative Respiratory: negative Cardiovascular: negative Gastrointestinal: negative Genitourinary:negative Integument/breast: negative Hematologic/lymphatic: negative Musculoskeletal:positive for back pain Neurological: negative Behavioral/Psych: negative Endocrine: negative Allergic/Immunologic: negative   Objective:  There were no vitals filed for this visit.        Assessment:  Encounter for postpartum visit  Normal postpartum exam.  Encounter for surveillance of contraceptive device - No complaints with Nexplanon  Other acute back pain  - Ambulatory referral to Chiropractic     Plan:  Essential components of care per ACOG recommendations:  1.  Mood and well being: Patient with negative depression screening today. Reviewed local resources for support.  - Patient does not use tobacco.  - hx of drug use? No    2. Infant care and feeding:  -Patient currently breastmilk feeding? Yes If breastmilk feeding discussed return to work and pumping. If needed, patient was provided letter for work to allow for every 2-3 hr pumping breaks, and to be granted a private location to express breastmilk and refrigerated area to store breastmilk. Reviewed importance of draining breast regularly to support lactation. -Social determinants of health (SDOH) reviewed in EPIC. No concerns  3. Sexuality, contraception and birth spacing - Patient does not want a pregnancy in the next year.  Desired family size is 2 children.  - Reviewed forms of contraception in tiered fashion. Patient desired Nexplanon today.   - Discussed birth spacing of 18 months  4. Sleep and fatigue -Encouraged family/partner/community support of 4 hrs of uninterrupted sleep to help with mood and fatigue  5. Physical Recovery  - Discussed patients delivery and complications - Patient had a 3rd degree laceration, perineal  healing reviewed. Patient expressed  understanding - Patient has urinary incontinence? No  - Patient is safe to resume physical and sexual activity  6.  Health Maintenance - Last pap smear done 02/04/2019 and was normal with negative HPV. Mammogram N/A  7. Chronic Disease - PCP follow up  10 minutes of non-face-to-face time spent with the patient. There was 5 minutes of chart review time spent prior to this encounter. Total time spent = 15 minutes.    Laury Deep, [place] [facility]

## 2019-09-29 NOTE — Patient Instructions (Signed)

## 2020-07-10 ENCOUNTER — Telehealth: Payer: Self-pay | Admitting: Obstetrics and Gynecology

## 2020-07-10 NOTE — Telephone Encounter (Signed)
Spoke to [place] from [facility]. She was requesting records for [place]. I informed Trinna Post I would need to have a signed ROI form the patient before I could release any records. She stated she had a signed released, and she would be sending Sylvie Farrier to pick them up. I called Faithe, and asked her if she had signed a release with the office here, or with [facility]. She said she couldn't remember. I asked her if she could come in the office tomorrow, and sign a ROI. I told her to call Alex at the [facility] and let her know I could not give out any information with a signed ROI that she (Kema), had signed in front of me. She said she would try to come in the office, and she would call Alex and let her know.

## 2020-08-25 ENCOUNTER — Other Ambulatory Visit: Payer: Self-pay

## 2020-08-25 ENCOUNTER — Encounter (HOSPITAL_COMMUNITY): Payer: Self-pay | Admitting: *Deleted

## 2020-08-25 ENCOUNTER — Ambulatory Visit (HOSPITAL_COMMUNITY)
Admission: EM | Admit: 2020-08-25 | Discharge: 2020-08-25 | Disposition: A | Discharge status: Home / Self Care | Payer: Medicaid Other | Attending: Student | Admitting: Student

## 2020-08-25 DIAGNOSIS — K047 Periapical abscess without sinus: Secondary | ICD-10-CM

## 2020-08-25 MED ORDER — LIDOCAINE VISCOUS HCL 2 % MT SOLN
15.0000 mL | OROMUCOSAL | 0 refills | Status: AC | PRN
Start: 2020-08-25 — End: ?

## 2020-08-25 MED ORDER — AMOXICILLIN-POT CLAVULANATE 875-125 MG PO TABS: 1.0000 | ORAL_TABLET | Freq: Two times a day (BID) | ORAL | 0 refills | Status: AC

## 2020-08-25 NOTE — Discharge Instructions (Addendum)
-  Start the antibiotic-Augmentin (amoxicillin-clavulanate), 1 pill every 12 hours for 7 days.  You can take this with food like with breakfast and dinner. -For dental infection, use lidocaine mouthwash up to every 4 hours. Make sure not to eat for at least 1 hour after using this, as your mouth will be very numb and you could bite yourself. -For additional relief, Take Tylenol 1000 mg 3 times daily, and ibuprofen 800 mg 3 times daily with food.  You can take these together, or alternate every 3-4 hours. -Follow-up with dentist as scheduled 7/22. -Seek additional medical attention if your symptoms get worse instead of better, like new fever/chills, foul taste in mouth, pain of the floor roof of your mouth, etc.

## 2020-08-25 NOTE — ED Provider Notes (Signed)
[facility]    CSN: [id number] Arrival date & time: 08/25/20  1514      History   Chief Complaint Chief Complaint  Patient presents with  . Dental Problem    HPI Michelle Gallagher is a 25 y.o. female presenting with dental pain for 8 days.  Medical history noncontributory.  Endorses pain and swelling of the gums by the left upper molars for about 1 week.  This actually has improved somewhat.  Denies fever/chills, foul taste in mouth, pain of the floor roof of mouth, trouble swallowing.  HPI  Past Medical History:  Diagnosis Date  . Alpha thalassemia silent carrier   . Asthma   . COVID-19 08/06/2019  . Obesity   . Pneumonia   . Polyhydramnios     Patient Active Problem List   Diagnosis Date Noted  . Vacuum-assisted vaginal delivery 08/17/2019  . Type 3c perineal laceration 08/17/2019  . Indication for care in labor and delivery, antepartum 08/16/2019  . Pneumonia due to COVID-19 virus 08/09/2019  . COVID-19 infection affecting pregnancy in third trimester 08/07/2019  . Polyhydramnios affecting pregnancy 08/03/2019  . Abnormal antenatal AFP screen 03/15/2019  . Maternal morbid obesity, antepartum (HCC) 02/04/2019  . Supervision of high-risk pregnancy 01/24/2019    Past Surgical History:  Procedure Laterality Date  . NO PAST SURGERIES      OB History    Gravida  1   Para  1   Term  1   Preterm  0   AB  0   Living  1     SAB  0   IAB  0   Ectopic  0   Multiple  0   Live Births  1            Home Medications    Prior to Admission medications   Medication Sig Start Date End Date Taking? Authorizing Provider  amoxicillin-clavulanate (AUGMENTIN) 875-125 MG tablet Take 1 tablet by mouth every 12 (twelve) hours. 08/25/20  Yes Rhys Martini, PA-C  lidocaine (XYLOCAINE) 2 % solution Use as directed 15 mLs in the mouth or throat as needed for mouth pain. 08/25/20  Yes Rhys Martini, PA-C  acetaminophen (TYLENOL) 325 MG tablet Take  2 tablets (650 mg total) by mouth every 6 (six) hours as needed (for pain scale < 4). 08/19/19   Fair, Hoyle Sauer, MD  promethazine (PHENERGAN) 25 MG tablet Take 1 tablet (25 mg total) by mouth every 6 (six) hours as needed for nausea or vomiting. 06/15/17 12/14/18  Aviva Signs, CNM    Family History Family History  Problem Relation Age of Onset  . Multiple sclerosis Mother     Social History Social History   Tobacco Use  . Smoking status: Former Smoker    Packs/day: 0.10    Types: Cigarettes    Quit date: 12/07/2018    Years since quitting: 1.7  . Smokeless tobacco: Never Used  Vaping Use  . Vaping Use: Never used  Substance Use Topics  . Alcohol use: Not Currently  . Drug use: No     Allergies   Pollen extract   Review of Systems Review of Systems  HENT: Positive for dental problem.   All other systems reviewed and are negative.    Physical Exam Triage Vital Signs ED Triage Vitals  Enc Vitals Group     BP 08/25/20 1639 (!) 134/94     Pulse Rate 08/25/20 1639 64     Resp  08/25/20 1639 18     Temp 08/25/20 1639 98.4 F (36.9 C)     Temp src --      SpO2 08/25/20 1639 100 %     Weight --      Height --      Head Circumference --      Peak Flow --      Pain Score 08/25/20 1637 5     Pain Loc --      Pain Edu? --      Excl. in GC? --    No data found.  Updated Vital Signs BP (!) 134/94   Pulse 64   Temp 98.4 F (36.9 C)   Resp 18   LMP 08/13/2020   SpO2 100%   Visual Acuity Right Eye Distance:   Left Eye Distance:   Bilateral Distance:    Right Eye Near:   Left Eye Near:    Bilateral Near:     Physical Exam Vitals reviewed.  Constitutional:      General: She is not in acute distress.    Appearance: Normal appearance. She is not ill-appearing, toxic-appearing or diaphoretic.  HENT:     Head: Normocephalic and atraumatic.     Jaw: There is normal jaw occlusion. No trismus, tenderness, swelling, pain on movement or malocclusion.      Salivary Glands: Right salivary gland is not diffusely enlarged or tender. Left salivary gland is not diffusely enlarged or tender.     Right Ear: Hearing normal.     Left Ear: Hearing normal.     Nose: Nose normal.     Mouth/Throat:     Lips: Pink.     Mouth: Mucous membranes are moist. No lacerations or oral lesions.     Dentition: Abnormal dentition. Does not have dentures. Dental tenderness, gingival swelling and dental caries present.     Tongue: No lesions. Tongue does not deviate from midline.     Palate: No mass.     Pharynx: Oropharynx is clear. Uvula midline. No oropharyngeal exudate or posterior oropharyngeal erythema.     Tonsils: No tonsillar exudate or tonsillar abscesses.     Comments: Poor dentician  L upper molars with some surrounding erythema and mild swelling. TTP. No discharge. Eyes:     Extraocular Movements: Extraocular movements intact.     Pupils: Pupils are equal, round, and reactive to light.  Cardiovascular:     Rate and Rhythm: Normal rate and regular rhythm.     Heart sounds: Normal heart sounds.  Pulmonary:     Effort: Pulmonary effort is normal.     Breath sounds: Normal breath sounds.  Skin:    General: Skin is warm.  Neurological:     General: No focal deficit present.     Mental Status: She is alert and oriented to person, place, and time.  Psychiatric:        Mood and Affect: Mood normal.        Behavior: Behavior normal.        Thought Content: Thought content normal.        Judgment: Judgment normal.      UC Treatments / Results  Labs (all labs ordered are listed, but only abnormal results are displayed) Labs Reviewed - No data to display  EKG   Radiology No results found.  Procedures Procedures (including critical care time)  Medications Ordered in UC Medications - No data to display  Initial Impression / Assessment and Plan / UC Course  I have reviewed the triage vital signs and the nursing notes.  Pertinent labs &  imaging results that were available during my care of the patient were reviewed by me and considered in my medical decision making (see chart for details).     This patient is a 25 year old female presenting for dental infection.  Afebrile, nontachycardic.  No tenderness of the floor or roof of mouth, very low suspicion for Ludwig's angina.  States she cannot be pregnant and is not breast-feeding.  Augmentin as below, viscous lidocaine for relief.  Follow-up with dentist as scheduled in 2 months.  ED return precautions discussed.  Final Clinical Impressions(s) / UC Diagnoses   Final diagnoses:  Dental infection     Discharge Instructions     -Start the antibiotic-Augmentin (amoxicillin-clavulanate), 1 pill every 12 hours for 7 days.  You can take this with food like with breakfast and dinner. -For dental infection, use lidocaine mouthwash up to every 4 hours. Make sure not to eat for at least 1 hour after using this, as your mouth will be very numb and you could bite yourself. -For additional relief, Take Tylenol 1000 mg 3 times daily, and ibuprofen 800 mg 3 times daily with food.  You can take these together, or alternate every 3-4 hours. -Follow-up with dentist as scheduled 7/22. -Seek additional medical attention if your symptoms get worse instead of better, like new fever/chills, foul taste in mouth, pain of the floor roof of your mouth, etc.     ED Prescriptions    Medication Sig Dispense Auth. Provider   amoxicillin-clavulanate (AUGMENTIN) 875-125 MG tablet Take 1 tablet by mouth every 12 (twelve) hours. 14 tablet Ignacia Bayley E, PA-C   lidocaine (XYLOCAINE) 2 % solution Use as directed 15 mLs in the mouth or throat as needed for mouth pain. 100 mL Rhys Martini, PA-C     PDMP not reviewed this encounter.   Rhys Martini, PA-C 08/25/20 1708

## 2020-08-25 NOTE — ED Triage Notes (Signed)
Dental pain started May 13

## 2021-11-12 IMAGING — US US MFM OB FOLLOW-UP
1 series · 13 of 28 positions shown · non-contrast
Comparison: none

[Series 1: us mfm ob follow-up · 64 acquisitions, 13 frames shown]
[im 3/64]
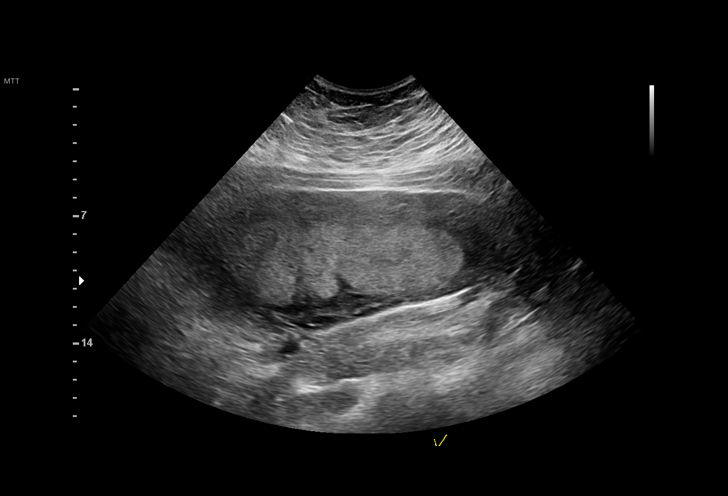
[im 8/64]
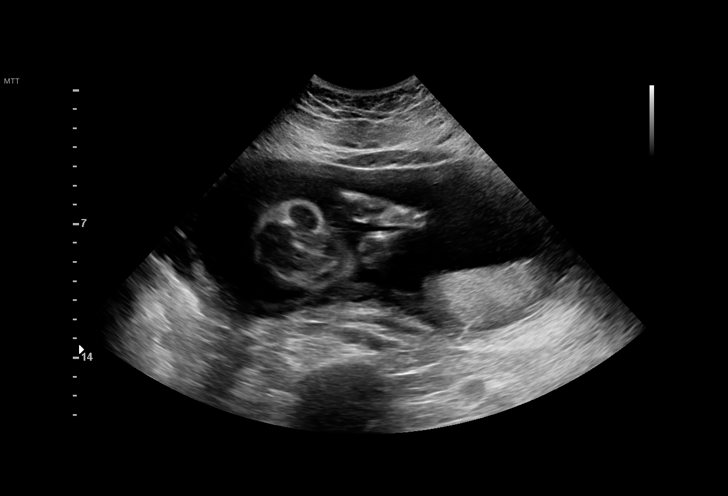
[im 12/64]
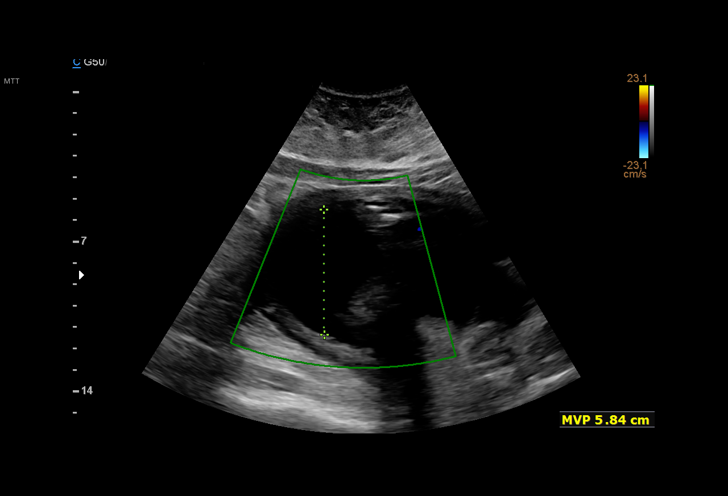
[im 17/64]
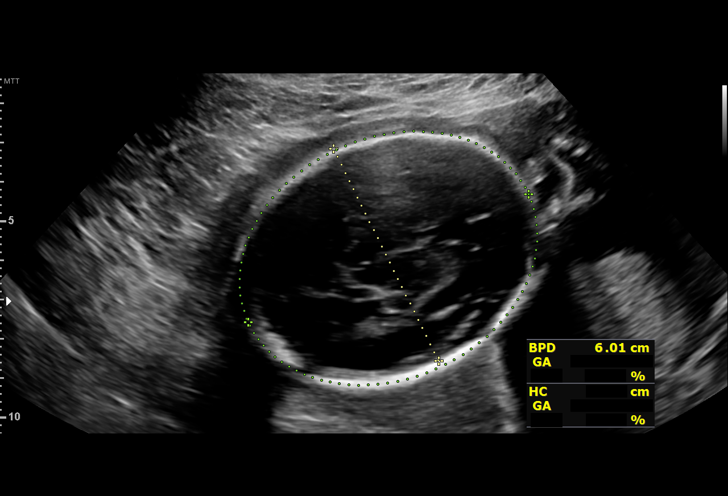
[im 22/64]
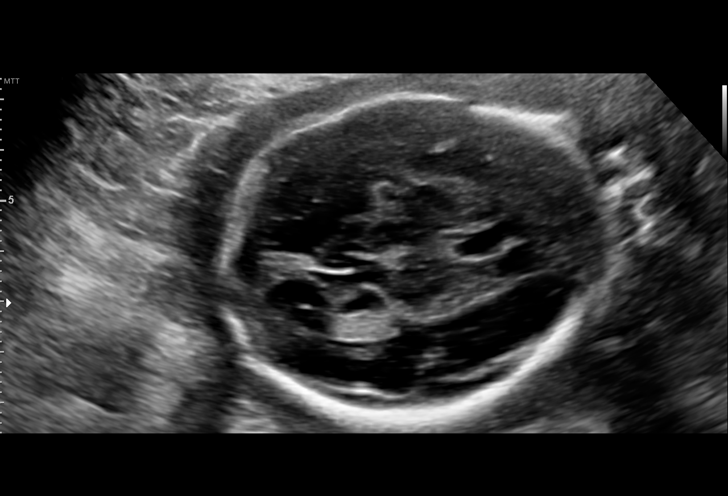
[im 26/64]
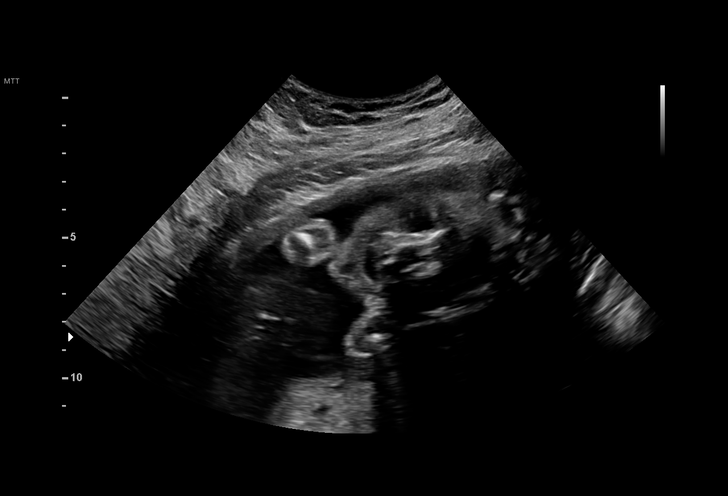
[im 33/64]
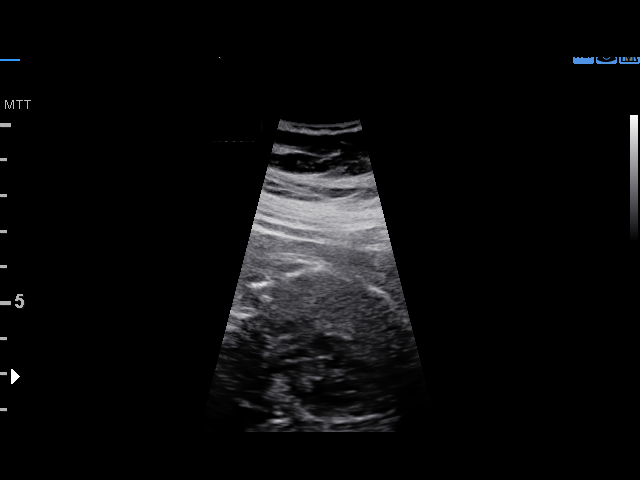
[im 38/64]
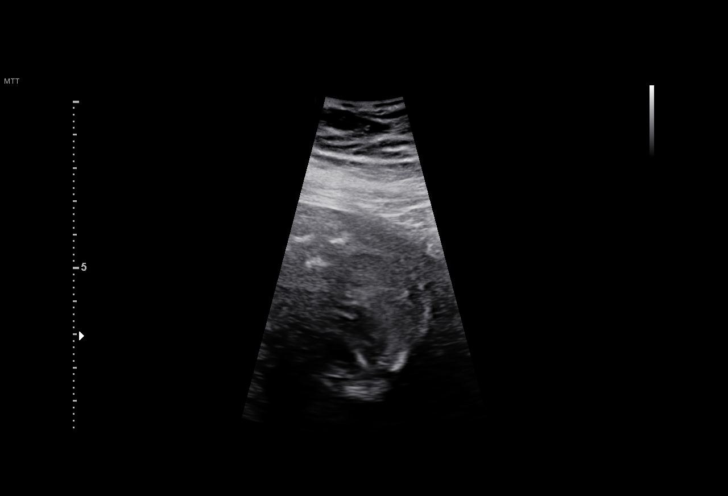
[im 43/64]
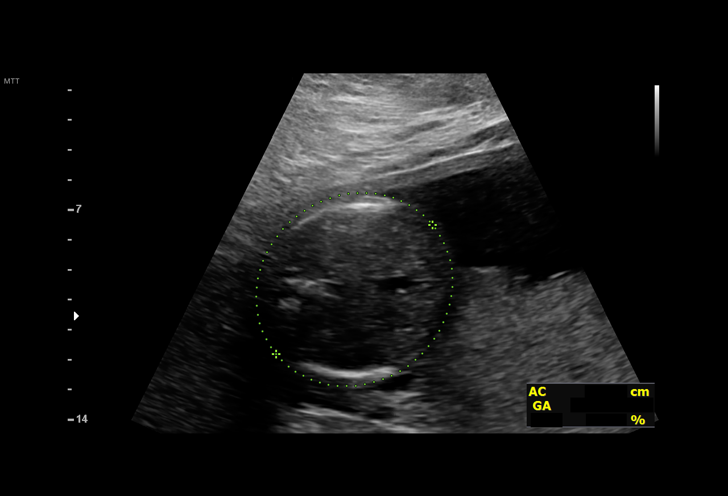
[im 47/64]
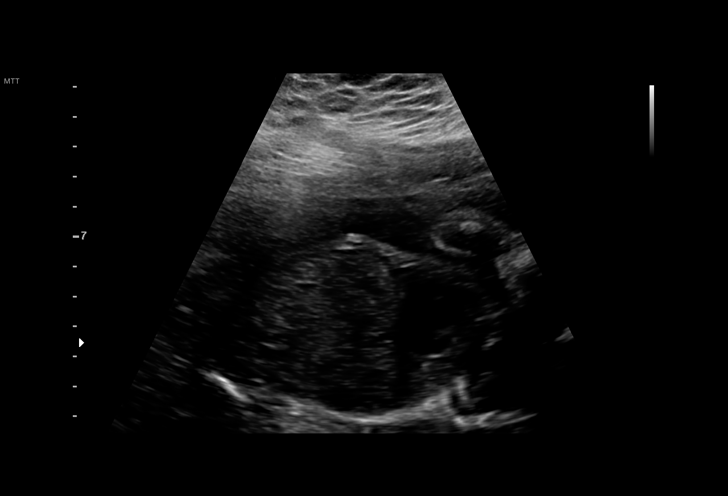
[im 52/64]
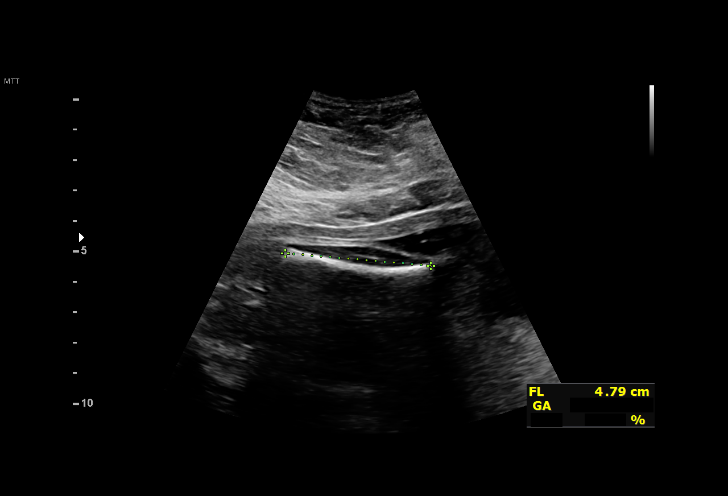
[im 57/64]
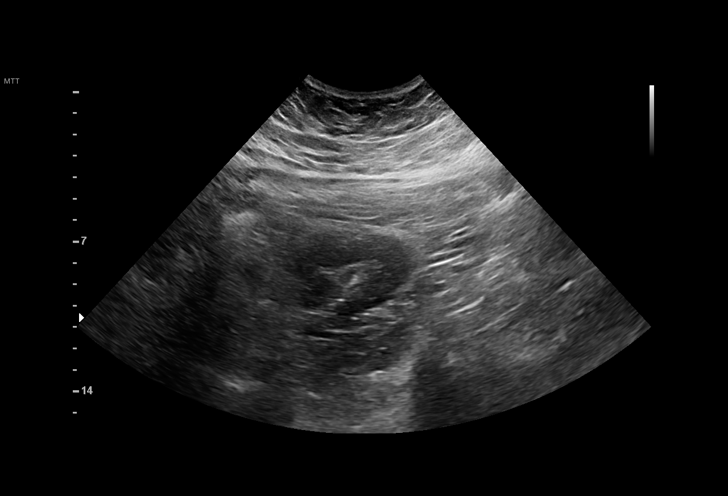
[im 61/64]
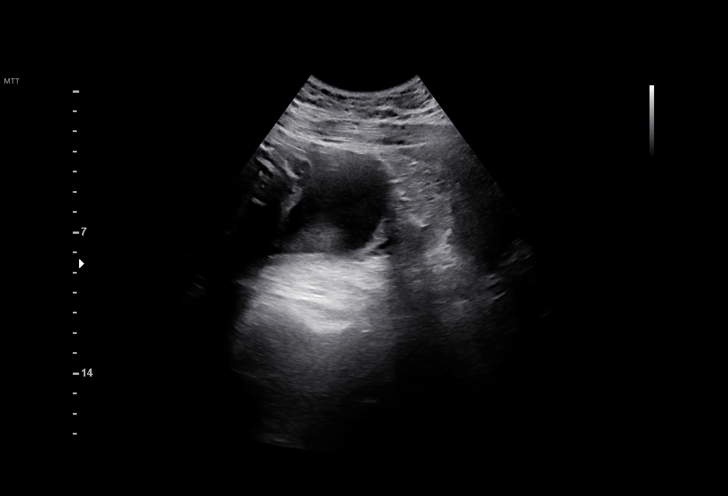

[13 of 28 positions shown; findings below may reference images not displayed]

----------------------------------------------------------------------

 ----------------------------------------------------------------------
Indications

  Abnormal biochemical screen (AFP positive)
  Obesity complicating pregnancy, second
  trimester (pregravid BMI 45.76)
  24 weeks gestation of pregnancy
  Encounter for antenatal screening for
  malformations (low risk NIPS, 8.8VV)
  Genetic carrier (Kavkasia Favlenishvili)
 ----------------------------------------------------------------------
Fetal Evaluation

 Num Of Fetuses:         1
 Fetal Heart Rate(bpm):  151
 Cardiac Activity:       Observed
 Presentation:           Cephalic
 Placenta:               Posterior
 P. Cord Insertion:      Previously Visualized

 Amniotic Fluid
 AFI FV:      Within normal limits

                             Largest Pocket(cm)

Biometry

 BPD:      59.8  mm     G. Age:  24w 3d         36  %    CI:        74.56   %    70 - 86
                                                         FL/HC:      21.7   %    18.7 -
 HC:      219.8  mm     G. Age:  24w 0d         14  %    HC/AC:      1.09        1.04 -
 AC:      202.4  mm     G. Age:  24w 6d         50  %    FL/BPD:     79.8   %    71 - 87
 FL:       47.7  mm     G. Age:  26w 0d         79  %    FL/AC:      23.6   %    20 - 24
 CER:      27.5  mm     G. Age:  24w 5d         55  %
 LV:        4.9  mm

 Est. FW:     776  gm    1 lb 11 oz      67  %
OB History

 Gravidity:    1         Term:   0        Prem:   0        SAB:   0
 TOP:          0       Ectopic:  0        Living: 0
Gestational Age

 LMP:           25w 4d        Date:  11/11/18                 EDD:   08/18/19
 U/S Today:     24w 6d                                        EDD:   08/23/19
 Best:          24w 4d     Det. By:  Early Ultrasound         EDD:   08/25/19
                                     (01/03/19)
Anatomy

 Cranium:               Appears normal         Aortic Arch:            Previously seen
 Cavum:                 Appears normal         Ductal Arch:            Previously seen
 Ventricles:            Appears normal         Diaphragm:              Appears normal
 Choroid Plexus:        Previously seen        Stomach:                Appears normal, left
                                                                       sided
 Cerebellum:            Appears normal         Abdomen:                Previously seen
 Posterior Fossa:       Previously seen        Abdominal Wall:         Previously seen
 Nuchal Fold:           Not applicable (>20    Cord Vessels:           Appears normal (3
                        wks GA)                                        vessel cord)
 Face:                  Orbits and profile     Kidneys:                Appear normal
                        previously seen
 Lips:                  Previously seen        Bladder:                Appears normal
 Thoracic:              Previously seen        Spine:                  Previously seen
 Heart:                 Appears normal         Upper Extremities:      Previously seen
                        (4CH, axis, and
                        situs)
 RVOT:                  Previously seen        Lower Extremities:      Previously seen
 LVOT:                  Appears normal

 Other:  Nasal bone previously seen.  Hands & Feet visualized.  Technically
         difficult due to maternal habitus and fetal position.
Cervix Uterus Adnexa

 Cervix
 Not visualized (advanced GA >41wks)

 Uterus
 No abnormality visualized.

 Left Ovary
 No adnexal mass visualized.

 Right Ovary
 No adnexal mass visualized.

 Cul De Sac
 No free fluid seen.

 Adnexa
 No abnormality visualized.
Comments

 This patient was seen for a follow up growth scan due to
 abnormal serum analytes noted on her quad screen.  She
 denies any problems since her last exam.
 She was informed that the fetal growth and amniotic fluid
 level appears appropriate for her gestational age.
 A follow up exam was scheduled in 4 weeks.

## 2021-12-29 ENCOUNTER — Other Ambulatory Visit: Payer: Self-pay

## 2021-12-29 ENCOUNTER — Encounter (HOSPITAL_COMMUNITY): Payer: Self-pay

## 2021-12-29 ENCOUNTER — Emergency Department (HOSPITAL_COMMUNITY): Payer: Medicaid Other

## 2021-12-29 ENCOUNTER — Emergency Department (HOSPITAL_COMMUNITY)
Admission: EM | Admit: 2021-12-29 | Discharge: 2021-12-29 | Disposition: A | Discharge status: Home / Self Care | Payer: Medicaid Other | Attending: Emergency Medicine | Admitting: Emergency Medicine

## 2021-12-29 DIAGNOSIS — N9489 Other specified conditions associated with female genital organs and menstrual cycle: Secondary | ICD-10-CM | POA: Diagnosis not present

## 2021-12-29 DIAGNOSIS — R1031 Right lower quadrant pain: Secondary | ICD-10-CM | POA: Diagnosis not present

## 2021-12-29 DIAGNOSIS — R1011 Right upper quadrant pain: Secondary | ICD-10-CM | POA: Insufficient documentation

## 2021-12-29 DIAGNOSIS — R112 Nausea with vomiting, unspecified: Secondary | ICD-10-CM | POA: Insufficient documentation

## 2021-12-29 DIAGNOSIS — K8020 Calculus of gallbladder without cholecystitis without obstruction: Secondary | ICD-10-CM

## 2021-12-29 LAB — COMPREHENSIVE METABOLIC PANEL
ALT: 13 U/L (ref 0–44)
AST: 14 U/L — ABNORMAL LOW (ref 15–41)
Albumin: 3.5 g/dL (ref 3.5–5.0)
Alkaline Phosphatase: 64 U/L (ref 38–126)
Anion gap: 8 (ref 5–15)
BUN: 15 mg/dL (ref 6–20)
CO2: 23 mmol/L (ref 22–32)
Calcium: 8.8 mg/dL — ABNORMAL LOW (ref 8.9–10.3)
Chloride: 107 mmol/L (ref 98–111)
Creatinine, Ser: 0.83 mg/dL (ref 0.44–1.00)
GFR, Estimated: >60 mL/min (ref >60)
Glucose, Bld: 122 mg/dL — ABNORMAL HIGH (ref 70–99)
Potassium: 3.9 mmol/L (ref 3.5–5.1)
Sodium: 138 mmol/L (ref 135–145)
Total Bilirubin: 0.3 mg/dL (ref 0.3–1.2)
Total Protein: 7.2 g/dL (ref 6.5–8.1)

## 2021-12-29 LAB — CBC
HCT: 38.1 % (ref 36.0–46.0)
Hemoglobin: 12 g/dL (ref 12.0–15.0)
MCH: 23.4 pg — ABNORMAL LOW (ref 26.0–34.0)
MCHC: 31.5 g/dL (ref 30.0–36.0)
MCV: 74.4 fL — ABNORMAL LOW (ref 80.0–100.0)
Platelets: 263 10*3/uL (ref 150–400)
RBC: 5.12 MIL/uL — ABNORMAL HIGH (ref 3.87–5.11)
RDW: 17.5 % — ABNORMAL HIGH (ref 11.5–15.5)
WBC: 9.6 10*3/uL (ref 4.0–10.5)
nRBC: 0 % (ref 0.0–0.2)

## 2021-12-29 LAB — URINALYSIS, ROUTINE W REFLEX MICROSCOPIC
Bilirubin Urine: NEGATIVE
Glucose, UA: NEGATIVE mg/dL
Hgb urine dipstick: NEGATIVE
Ketones, ur: NEGATIVE mg/dL
Leukocytes,Ua: NEGATIVE
Nitrite: NEGATIVE
Protein, ur: NEGATIVE mg/dL
Specific Gravity, Urine: 1.019 (ref 1.005–1.030)
pH: 5 (ref 5.0–8.0)

## 2021-12-29 LAB — LIPASE, BLOOD: Lipase: 27 U/L (ref 11–51)

## 2021-12-29 LAB — I-STAT BETA HCG BLOOD, ED (MC, WL, AP ONLY): I-stat hCG, quantitative: <5 m[IU]/mL (ref <5)

## 2021-12-29 LAB — HCG, QUANTITATIVE, PREGNANCY: hCG, Beta Chain, Quant, S: 1 m[IU]/mL (ref <5)

## 2021-12-29 MED ORDER — SODIUM CHLORIDE 0.9 % IV BOLUS
1000.0000 mL | Freq: Once | INTRAVENOUS | Status: AC
  Administered 2021-12-29: 1000 mL via INTRAVENOUS

## 2021-12-29 MED ORDER — MORPHINE SULFATE (PF) 4 MG/ML IV SOLN
4.0000 mg | Freq: Once | INTRAVENOUS | Status: AC
  Administered 2021-12-29: 4 mg via INTRAVENOUS
  Filled 2021-12-29: qty 1

## 2021-12-29 MED ORDER — IOHEXOL 350 MG/ML SOLN
100.0000 mL | Freq: Once | INTRAVENOUS | Status: AC | PRN
  Administered 2021-12-29: 100 mL via INTRAVENOUS

## 2021-12-29 MED ORDER — ONDANSETRON HCL 4 MG/2ML IJ SOLN
4.0000 mg | Freq: Once | INTRAMUSCULAR | Status: AC
  Administered 2021-12-29: 4 mg via INTRAVENOUS
  Filled 2021-12-29: qty 2

## 2021-12-29 MED ORDER — ONDANSETRON HCL 4 MG PO TABS: 4.0000 mg | ORAL_TABLET | Freq: Three times a day (TID) | ORAL | 0 refills | Status: AC | PRN

## 2021-12-29 NOTE — Discharge Instructions (Signed)
You have been diagnosed with gallstone which is likely the cause of your abdominal pain.  At this time please avoid any food that has fatty content as it may worsen your pain.  Call and follow-up closely with general surgery for outpatient management of your condition which may include surgical removal of your gallbladder.  If you develop worsening pain nausea and vomiting fever or if you have any concern do not hesitate to return to the ER for further care.

## 2021-12-29 NOTE — ED Notes (Signed)
Pt ambulatory to bathroom

## 2021-12-29 NOTE — ED Provider Notes (Signed)
Received signout from previous provider, please see his note for complete H&P.  This is a 26 year old female presenting with complaints of right-sided abdominal pain with associate nausea and vomiting that started earlier today.  On initial exam patient has reproducible tenderness to the right upper and right lower quadrant along with some right-sided CVA tenderness.  Labs obtained independently viewed interpreted by me.  Patient has a negative pregnancy test.  Normal WBC, normal H&H, normal lipase, electrolyte panels are reassuring.  Urinalysis and abdominal pelvis CT and have been ordered but have not resulted yet.  Patient has received morphine, Zofran, and IV fluid with improvement of symptoms after reassessment.  CT scan of abdomen pelvis was obtained independently viewed and interpreted by me and I agree with radiologist interpretation.  Evidence of cholelithiasis with equivocal mild gallbladder wall thickening.  On reassessment patient has no reproducible abdominal pain.  Will obtain limited abdominal ultrasound to rule out acute cholecystitis.  7:55 PM Limited abdominal ultrasound demonstrated evidence of cholelithiasis with borderline thickening of the gallbladder wall.  No pericholecystic fluid and the findings are inconclusive regarding to acute cholecystitis.  I have considered admission however At this time patient is symptom-free and after discussion I felt patient is stable to be discharged home with outpatient follow-up with [facility] surgery for further management of her condition.  I encouraged patient to avoid fatty food in the meantime.  Will discharge home with antiemetic.  Strict return precaution given.  BP (!) 116/55   Pulse 76   Temp 97.6 F (36.4 C) (Tympanic)   Resp (!) 25   SpO2 100%   Results for orders placed or performed during the hospital encounter of 12/29/21  Lipase, blood  Result Value Ref Range   Lipase 27 11 - 51 U/L  Comprehensive metabolic  panel  Result Value Ref Range   Sodium 138 135 - 145 mmol/L   Potassium 3.9 3.5 - 5.1 mmol/L   Chloride 107 98 - 111 mmol/L   CO2 23 22 - 32 mmol/L   Glucose, Bld 122 (H) 70 - 99 mg/dL   BUN 15 6 - 20 mg/dL   Creatinine, Ser 0.83 0.44 - 1.00 mg/dL   Calcium 8.8 (L) 8.9 - 10.3 mg/dL   Total Protein 7.2 6.5 - 8.1 g/dL   Albumin 3.5 3.5 - 5.0 g/dL   AST 14 (L) 15 - 41 U/L   ALT 13 0 - 44 U/L   Alkaline Phosphatase 64 38 - 126 U/L   Total Bilirubin 0.3 0.3 - 1.2 mg/dL   GFR, Estimated >60 >60 mL/min   Anion gap 8 5 - 15  Urinalysis, Routine w reflex microscopic Urine, Clean Catch  Result Value Ref Range   Color, Urine YELLOW YELLOW   APPearance HAZY (A) CLEAR   Specific Gravity, Urine 1.019 1.005 - 1.030   pH 5.0 5.0 - 8.0   Glucose, UA NEGATIVE NEGATIVE mg/dL   Hgb urine dipstick NEGATIVE NEGATIVE   Bilirubin Urine NEGATIVE NEGATIVE   Ketones, ur NEGATIVE NEGATIVE mg/dL   Protein, ur NEGATIVE NEGATIVE mg/dL   Nitrite NEGATIVE NEGATIVE   Leukocytes,Ua NEGATIVE NEGATIVE  CBC  Result Value Ref Range   WBC 9.6 4.0 - 10.5 K/uL   RBC 5.12 (H) 3.87 - 5.11 MIL/uL   Hemoglobin 12.0 12.0 - 15.0 g/dL   HCT 38.1 36.0 - 46.0 %   MCV 74.4 (L) 80.0 - 100.0 fL   MCH 23.4 (L) 26.0 - 34.0 pg   MCHC 31.5 30.0 -  36.0 g/dL   RDW 17.5 (H) 11.5 - 15.5 %   Platelets 263 150 - 400 K/uL   nRBC 0.0 0.0 - 0.2 %  hCG, quantitative, pregnancy  Result Value Ref Range   hCG, Beta Chain, Quant, S 1 <5 mIU/mL  I-Stat beta hCG blood, ED  Result Value Ref Range   I-stat hCG, quantitative <5.0 <5 mIU/mL   Comment 3           US Abdomen Limited  Result Date: 12/29/2021 CLINICAL DATA:  Acute right abdominal pain. EXAM: ULTRASOUND ABDOMEN LIMITED RIGHT UPPER QUADRANT COMPARISON:  CT from the same date. FINDINGS: Gallbladder: Multiple layering gallstones measuring up to 1.7 cm. Borderline gallbladder wall thickening of 3.5 mm. Common bile duct: Diameter: Normal in caliber measuring 2.7 mm. Liver: No focal  lesion identified. Diffusely increased parenchymal echogenicity. Portal vein is patent on color Doppler imaging with normal direction of blood flow towards the liver. Other: None. IMPRESSION: Cholelithiasis with borderline thickening of the gallbladder wall. No pericholecystic fluid. The findings are inconclusive regarding acute cholecystitis. Hepatic steatosis. Electronically Signed   By: Fidela Salisbury M.D.   On: 12/29/2021 18:36   CT Abdomen Pelvis W Contrast  Result Date: 12/29/2021 CLINICAL DATA:  26 year old female with acute RIGHT abdominal and pelvic pain. EXAM: CT ABDOMEN AND PELVIS WITH CONTRAST TECHNIQUE: Multidetector CT imaging of the abdomen and pelvis was performed using the standard protocol following bolus administration of intravenous contrast. RADIATION DOSE REDUCTION: This exam was performed according to the departmental dose-optimization program which includes automated exposure control, adjustment of the mA and/or kV according to patient size and/or use of iterative reconstruction technique. CONTRAST:  141mL OMNIPAQUE IOHEXOL 350 MG/ML SOLN COMPARISON:  None Available. FINDINGS: Lower chest: No acute abnormality. Hepatobiliary: Probable mild hepatic steatosis noted. Cholelithiasis identified, some in the region of the gallbladder neck. Equivocal mild gallbladder wall thickening noted. There is no evidence of intrahepatic or extrahepatic biliary dilatation. Pancreas: Unremarkable Spleen: Unremarkable Adrenals/Urinary Tract: The kidneys, adrenal glands and bladder are unremarkable. Stomach/Bowel: Stomach is within normal limits. Appendix appears normal. No evidence of bowel wall thickening, distention, or inflammatory changes. Vascular/Lymphatic: No significant vascular findings are present. No enlarged abdominal or pelvic lymph nodes. Reproductive: Uterus and bilateral adnexa are unremarkable. Other: No ascites, focal collection or pneumoperitoneum. Musculoskeletal: No acute or  suspicious bony abnormalities are noted. IMPRESSION: 1. Cholelithiasis with equivocal mild gallbladder wall thickening. Consider further evaluation with ultrasound. 2. Probable mild hepatic steatosis. Electronically Signed   By: Margarette Canada M.D.   On: 12/29/2021 16:04        Domenic Moras, PA-C 12/29/21 2000    Malvin Johns, MD 12/29/21 2139

## 2021-12-29 NOTE — ED Provider Notes (Signed)
[facility] EMERGENCY DEPARTMENT Provider Note   CSN: [id number] Arrival date & time: 12/29/21  9323     History  No chief complaint on file.   Michelle Gallagher is a 26 y.o. female.  Patient presents to the hospital complaining of right-sided abdominal pain, nausea, vomiting which began l at 5 AM.  The pain is described as sharp in nature and right-sided.  The patient is unable to differentiate if it is in the upper or lower abdomen at this time.  She also complains of radiation of some symptoms to the right flank.  The patient denies dysuria, hematuria, vaginal discharge, diarrhea, shortness of breath, chest pain.  Past medical history significant for obesity, pneumonia   HPI     Home Medications Prior to Admission medications   Medication Sig Start Date End Date Taking? Authorizing Provider  acetaminophen (TYLENOL) 325 MG tablet Take 2 tablets (650 mg total) by mouth every 6 (six) hours as needed (for pain scale < 4). 08/19/19   Fair, Hoyle Sauer, MD  amoxicillin-clavulanate (AUGMENTIN) 875-125 MG tablet Take 1 tablet by mouth every 12 (twelve) hours. 08/25/20   Rhys Martini, PA-C  lidocaine (XYLOCAINE) 2 % solution Use as directed 15 mLs in the mouth or throat as needed for mouth pain. 08/25/20   Rhys Martini, PA-C  promethazine (PHENERGAN) 25 MG tablet Take 1 tablet (25 mg total) by mouth every 6 (six) hours as needed for nausea or vomiting. 06/15/17 12/14/18  Aviva Signs, CNM      Allergies    Pollen extract    Review of Systems   Review of Systems  Constitutional:  Negative for fever.  Respiratory:  Negative for shortness of breath.   Cardiovascular:  Negative for chest pain.  Gastrointestinal:  Positive for abdominal pain, nausea and vomiting. Negative for constipation and diarrhea.  Genitourinary:  Negative for dysuria, hematuria and vaginal discharge.    Physical Exam Updated Vital Signs BP 119/73   Pulse 93   Temp 97.6 F (36.4 C)  (Tympanic)   Resp 18   SpO2 99%  Physical Exam Vitals and nursing note reviewed.  Constitutional:      General: She is in acute distress.     Appearance: She is well-developed. She is obese.  HENT:     Head: Normocephalic and atraumatic.     Mouth/Throat:     Mouth: Mucous membranes are moist.  Eyes:     Conjunctiva/sclera: Conjunctivae normal.  Cardiovascular:     Rate and Rhythm: Normal rate and regular rhythm.     Heart sounds: No murmur heard. Pulmonary:     Effort: Pulmonary effort is normal. No respiratory distress.     Breath sounds: Normal breath sounds.  Abdominal:     Palpations: Abdomen is soft.     Tenderness: There is abdominal tenderness. There is right CVA tenderness.     Comments: Tenderness to the right upper quadrant and right lower quadrant, mild right-sided CVA tenderness  Musculoskeletal:        General: No swelling.     Cervical back: Normal range of motion and neck supple.  Skin:    General: Skin is warm and dry.     Capillary Refill: Capillary refill takes less than 2 seconds.  Neurological:     Mental Status: She is alert.  Psychiatric:        Mood and Affect: Mood normal.     ED Results / Procedures / Treatments  Labs (all labs ordered are listed, but only abnormal results are displayed) Labs Reviewed  COMPREHENSIVE METABOLIC PANEL - Abnormal; Notable for the following components:      Result Value   Glucose, Bld 122 (*)    Calcium 8.8 (*)    AST 14 (*)    All other components within normal limits  CBC - Abnormal; Notable for the following components:   RBC 5.12 (*)    MCV 74.4 (*)    MCH 23.4 (*)    RDW 17.5 (*)    All other components within normal limits  LIPASE, BLOOD  HCG, QUANTITATIVE, PREGNANCY  URINALYSIS, ROUTINE W REFLEX MICROSCOPIC  I-STAT BETA HCG BLOOD, ED ([facility], [facility], [facility] ONLY)  I-STAT BETA HCG BLOOD, ED ([facility], [facility], [facility] ONLY)    EKG None  Radiology No results found.  Procedures Procedures    Medications Ordered in  ED Medications  morphine (PF) 4 MG/ML injection 4 mg (4 mg Intravenous Given 12/29/21 1213)  ondansetron (ZOFRAN) injection 4 mg (4 mg Intravenous Given 12/29/21 1212)  sodium chloride 0.9 % bolus 1,000 mL (0 mLs Intravenous Stopped 12/29/21 1346)    ED Course/ Medical Decision Making/ A&P                           Medical Decision Making Amount and/or Complexity of Data Reviewed Labs: ordered. Radiology: ordered.  Risk Prescription drug management.   This patient presents to the ED for concern of right-sided abdominal pain, this involves an extensive number of treatment options, and is a complaint that carries with it a high risk of complications and morbidity.  The differential diagnosis includes nephrolithiasis, appendicitis, cholecystitis, colitis, gastroenteritis, pyelonephritis, and others   Co morbidities that complicate the patient evaluation  Obesity   Additional history obtained:  No relevant external history available   Lab Tests:  I Ordered, and personally interpreted labs.  The pertinent results include: Unremarkable CBC, CMP   Imaging Studies ordered:  I ordered imaging studies including CT abdomen pelvis with contrast  Problem List / ED Course / Critical interventions / Medication management   I ordered medication including morphine for pain, Zofran for nausea, normal saline for hydration Reevaluation of the patient after these medicines showed that the patient improved I have reviewed the patients home medicines and have made adjustments as needed   Test / Admission - Considered:  The patient's symptoms improved significantly after medication administration.  The CT scan has been ordered but has not been completed at the time of shift handoff.  Disposition will be pending results of CT abdomen pelvis.  If CT is benign patient may likely discharge home with nausea medication. Domenic Moras, PA-C taking over patient care at shift handoff        Final  Clinical Impression(s) / ED Diagnoses Final diagnoses:  Right sided abdominal pain  Nausea and vomiting in adult    Rx / DC Orders ED Discharge Orders     None         Ronny Bacon 12/29/21 1527    Blanchie Dessert, MD 01/01/22 1325

## 2021-12-29 NOTE — ED Notes (Signed)
Patient transported to CT 

## 2021-12-29 NOTE — ED Triage Notes (Signed)
Patient complains of right sided abdominal pain with radiation to flank since 0500. Nausea and vomiting with same. Offered zofran but patient denied. Denis diarrhea. Denies dysuria

## 2022-09-18 ENCOUNTER — Encounter: Payer: Medicaid Other | Admitting: Obstetrics and Gynecology

## 2023-04-17 ENCOUNTER — Ambulatory Visit (HOSPITAL_COMMUNITY): Payer: Medicaid Other

## 2024-03-22 ENCOUNTER — Ambulatory Visit: Admitting: Physician Assistant

## 2024-09-14 ENCOUNTER — Ambulatory Visit: Admitting: Physician Assistant
